# Patient Record
Sex: Female | Born: 1935 | Race: White | Hispanic: No | Marital: Married | Smoking: Never smoker
Health system: Southern US, Community
[De-identification: ages and names within clinical notes are randomized; demographics above are authoritative.]

## PROBLEM LIST (undated history)

## (undated) ENCOUNTER — Inpatient Hospital Stay: Admission: EM | Payer: Self-pay | Source: Home / Self Care

## (undated) DIAGNOSIS — F32A Depression, unspecified: Secondary | ICD-10-CM

## (undated) DIAGNOSIS — E119 Type 2 diabetes mellitus without complications: Secondary | ICD-10-CM

## (undated) DIAGNOSIS — G20 Parkinson's disease: Secondary | ICD-10-CM

## (undated) DIAGNOSIS — M0520 Rheumatoid vasculitis with rheumatoid arthritis of unspecified site: Secondary | ICD-10-CM

## (undated) DIAGNOSIS — I639 Cerebral infarction, unspecified: Secondary | ICD-10-CM

## (undated) DIAGNOSIS — G629 Polyneuropathy, unspecified: Secondary | ICD-10-CM

## (undated) DIAGNOSIS — F329 Major depressive disorder, single episode, unspecified: Secondary | ICD-10-CM

## (undated) DIAGNOSIS — G20A1 Parkinson's disease without dyskinesia, without mention of fluctuations: Secondary | ICD-10-CM

## (undated) HISTORY — DX: Polyneuropathy, unspecified: G62.9

## (undated) HISTORY — DX: Type 2 diabetes mellitus without complications: E11.9

## (undated) HISTORY — DX: Cerebral infarction, unspecified: I63.9

## (undated) HISTORY — DX: Depression, unspecified: F32.A

## (undated) HISTORY — PX: APPENDECTOMY: SHX54

## (undated) HISTORY — PX: TONSILLECTOMY AND ADENOIDECTOMY: SUR1326

## (undated) HISTORY — PX: ABDOMINAL HYSTERECTOMY: SHX81

## (undated) HISTORY — DX: Rheumatoid vasculitis with rheumatoid arthritis of unspecified site: M05.20

## (undated) HISTORY — DX: Major depressive disorder, single episode, unspecified: F32.9

---

## 2012-02-20 ENCOUNTER — Other Ambulatory Visit: Payer: Self-pay | Admitting: Family Medicine

## 2012-02-20 DIAGNOSIS — Z1231 Encounter for screening mammogram for malignant neoplasm of breast: Secondary | ICD-10-CM

## 2012-03-05 ENCOUNTER — Ambulatory Visit: Payer: Self-pay

## 2012-03-16 ENCOUNTER — Ambulatory Visit: Payer: Self-pay

## 2012-04-06 ENCOUNTER — Ambulatory Visit
Admission: RE | Admit: 2012-04-06 | Discharge: 2012-04-06 | Disposition: A | Discharge status: Home / Self Care | Payer: BC Managed Care – PPO | Source: Ambulatory Visit | Attending: Family Medicine | Admitting: Family Medicine

## 2012-04-06 DIAGNOSIS — Z1231 Encounter for screening mammogram for malignant neoplasm of breast: Secondary | ICD-10-CM

## 2012-09-07 ENCOUNTER — Ambulatory Visit: Payer: BC Managed Care – PPO | Admitting: Physical Therapy

## 2012-09-18 ENCOUNTER — Ambulatory Visit: Payer: BC Managed Care – PPO | Admitting: *Deleted

## 2013-07-09 ENCOUNTER — Other Ambulatory Visit (HOSPITAL_BASED_OUTPATIENT_CLINIC_OR_DEPARTMENT_OTHER): Payer: Self-pay | Admitting: Rheumatology

## 2013-07-09 DIAGNOSIS — M5136 Other intervertebral disc degeneration, lumbar region: Secondary | ICD-10-CM

## 2013-07-13 ENCOUNTER — Ambulatory Visit (HOSPITAL_BASED_OUTPATIENT_CLINIC_OR_DEPARTMENT_OTHER): Payer: Medicare Other

## 2013-07-20 ENCOUNTER — Ambulatory Visit (HOSPITAL_BASED_OUTPATIENT_CLINIC_OR_DEPARTMENT_OTHER)
Admission: RE | Admit: 2013-07-20 | Discharge: 2013-07-20 | Disposition: A | Discharge status: Home / Self Care | Payer: MEDICARE | Source: Ambulatory Visit | Attending: Rheumatology | Admitting: Rheumatology

## 2013-07-20 DIAGNOSIS — M5136 Other intervertebral disc degeneration, lumbar region: Secondary | ICD-10-CM

## 2013-07-20 DIAGNOSIS — M25559 Pain in unspecified hip: Secondary | ICD-10-CM | POA: Insufficient documentation

## 2013-07-20 DIAGNOSIS — M48061 Spinal stenosis, lumbar region without neurogenic claudication: Secondary | ICD-10-CM | POA: Insufficient documentation

## 2013-07-20 DIAGNOSIS — M5137 Other intervertebral disc degeneration, lumbosacral region: Secondary | ICD-10-CM | POA: Insufficient documentation

## 2013-07-20 DIAGNOSIS — M549 Dorsalgia, unspecified: Secondary | ICD-10-CM | POA: Insufficient documentation

## 2013-07-20 DIAGNOSIS — M47817 Spondylosis without myelopathy or radiculopathy, lumbosacral region: Secondary | ICD-10-CM | POA: Insufficient documentation

## 2013-07-20 DIAGNOSIS — M51379 Other intervertebral disc degeneration, lumbosacral region without mention of lumbar back pain or lower extremity pain: Secondary | ICD-10-CM | POA: Insufficient documentation

## 2013-12-17 ENCOUNTER — Other Ambulatory Visit: Payer: Self-pay | Admitting: Rheumatology

## 2013-12-17 ENCOUNTER — Ambulatory Visit
Admission: RE | Admit: 2013-12-17 | Discharge: 2013-12-17 | Disposition: A | Discharge status: Home / Self Care | Payer: Medicare Other | Source: Ambulatory Visit | Attending: Rheumatology | Admitting: Rheumatology

## 2013-12-17 DIAGNOSIS — Z5189 Encounter for other specified aftercare: Secondary | ICD-10-CM

## 2014-03-06 ENCOUNTER — Ambulatory Visit
Admission: RE | Admit: 2014-03-06 | Discharge: 2014-03-06 | Disposition: A | Discharge status: Home / Self Care | Payer: MEDICARE | Source: Ambulatory Visit | Attending: Family Medicine | Admitting: Family Medicine

## 2014-03-06 ENCOUNTER — Other Ambulatory Visit: Payer: Self-pay | Admitting: Family Medicine

## 2014-03-06 DIAGNOSIS — M79672 Pain in left foot: Secondary | ICD-10-CM

## 2014-06-06 ENCOUNTER — Encounter (INDEPENDENT_AMBULATORY_CARE_PROVIDER_SITE_OTHER): Payer: BC Managed Care – PPO | Admitting: Ophthalmology

## 2014-06-06 DIAGNOSIS — Z79899 Other long term (current) drug therapy: Secondary | ICD-10-CM

## 2014-06-06 DIAGNOSIS — H43813 Vitreous degeneration, bilateral: Secondary | ICD-10-CM

## 2014-06-06 DIAGNOSIS — H33301 Unspecified retinal break, right eye: Secondary | ICD-10-CM

## 2014-06-20 ENCOUNTER — Ambulatory Visit (INDEPENDENT_AMBULATORY_CARE_PROVIDER_SITE_OTHER): Payer: Medicare Other | Admitting: Ophthalmology

## 2014-06-20 DIAGNOSIS — H33301 Unspecified retinal break, right eye: Secondary | ICD-10-CM

## 2014-10-20 ENCOUNTER — Ambulatory Visit (INDEPENDENT_AMBULATORY_CARE_PROVIDER_SITE_OTHER): Payer: Medicare (Managed Care) | Admitting: Ophthalmology

## 2014-10-20 DIAGNOSIS — H43813 Vitreous degeneration, bilateral: Secondary | ICD-10-CM

## 2014-10-20 DIAGNOSIS — H33302 Unspecified retinal break, left eye: Secondary | ICD-10-CM | POA: Diagnosis not present

## 2015-01-14 DEATH — deceased

## 2015-02-13 DEATH — deceased

## 2015-10-26 ENCOUNTER — Ambulatory Visit (INDEPENDENT_AMBULATORY_CARE_PROVIDER_SITE_OTHER): Payer: Medicare (Managed Care) | Admitting: Ophthalmology

## 2015-11-12 ENCOUNTER — Ambulatory Visit (INDEPENDENT_AMBULATORY_CARE_PROVIDER_SITE_OTHER): Payer: Medicare (Managed Care) | Admitting: Ophthalmology

## 2015-11-17 ENCOUNTER — Encounter: Payer: Self-pay | Admitting: Neurology

## 2015-11-17 ENCOUNTER — Ambulatory Visit (INDEPENDENT_AMBULATORY_CARE_PROVIDER_SITE_OTHER): Payer: Medicare Other | Admitting: Neurology

## 2015-11-17 VITALS — BP 104/54 | HR 61

## 2015-11-17 DIAGNOSIS — G20 Parkinson's disease: Secondary | ICD-10-CM

## 2015-11-17 DIAGNOSIS — R269 Unspecified abnormalities of gait and mobility: Secondary | ICD-10-CM | POA: Diagnosis not present

## 2015-11-17 MED ORDER — CARBIDOPA-LEVODOPA 25-100 MG PO TABS: 1.0000 | ORAL_TABLET | Freq: Three times a day (TID) | ORAL | Status: DC

## 2015-11-17 MED ORDER — ALPRAZOLAM 0.5 MG PO TABS: 0.5000 mg | ORAL_TABLET | Freq: Every evening | ORAL | Status: DC | PRN
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
## 2015-11-18 LAB — COMPREHENSIVE METABOLIC PANEL
A/G RATIO: 1.5 (ref 1.2–2.2)
ALK PHOS: 49 IU/L (ref 39–117)
ALT: 10 IU/L (ref 0–32)
AST: 14 IU/L (ref 0–40)
Albumin: 4 g/dL (ref 3.5–4.7)
BILIRUBIN TOTAL: 0.4 mg/dL (ref 0.0–1.2)
BUN/Creatinine Ratio: 28 (ref 12–28)
BUN: 21 mg/dL (ref 8–27)
CHLORIDE: 96 mmol/L (ref 96–106)
CO2: 24 mmol/L (ref 18–29)
Calcium: 9.3 mg/dL (ref 8.7–10.3)
Creatinine, Ser: 0.76 mg/dL (ref 0.57–1.00)
GFR calc Af Amer: 81 mL/min/{1.73_m2} (ref >59)
GFR calc non Af Amer: 70 mL/min/{1.73_m2} (ref >59)
GLUCOSE: 167 mg/dL — AB (ref 65–99)
Globulin, Total: 2.6 g/dL (ref 1.5–4.5)
POTASSIUM: 3.6 mmol/L (ref 3.5–5.2)
Sodium: 137 mmol/L (ref 134–144)
Total Protein: 6.6 g/dL (ref 6.0–8.5)

## 2015-11-18 LAB — C-REACTIVE PROTEIN: CRP: 1.9 mg/L (ref 0.0–4.9)

## 2015-11-18 LAB — CBC
Hematocrit: 36.1 % (ref 34.0–46.6)
Hemoglobin: 11.7 g/dL (ref 11.1–15.9)
MCH: 30.5 pg (ref 26.6–33.0)
MCHC: 32.4 g/dL (ref 31.5–35.7)
MCV: 94 fL (ref 79–97)
Platelets: 337 10*3/uL (ref 150–379)
RBC: 3.83 x10E6/uL (ref 3.77–5.28)
RDW: 13.9 % (ref 12.3–15.4)
WBC: 8.3 10*3/uL (ref 3.4–10.8)

## 2015-11-18 LAB — VITAMIN B12: VITAMIN B 12: 616 pg/mL (ref 211–946)

## 2015-11-18 LAB — ANA: Anti Nuclear Antibody(ANA): NEGATIVE

## 2015-11-18 LAB — TSH: TSH: 2.24 u[IU]/mL (ref 0.450–4.500)

## 2015-11-18 LAB — SEDIMENTATION RATE: Sed Rate: 22 mm/hr (ref 0–40)

## 2015-11-18 LAB — RPR: RPR: NONREACTIVE

## 2015-11-18 LAB — CK: CK TOTAL: 28 U/L (ref 24–173)

## 2015-11-26 ENCOUNTER — Ambulatory Visit
Admission: RE | Admit: 2015-11-26 | Discharge: 2015-11-26 | Disposition: A | Discharge status: Home / Self Care | Payer: Medicare (Managed Care) | Source: Ambulatory Visit | Attending: Neurology | Admitting: Neurology

## 2015-11-26 DIAGNOSIS — R269 Unspecified abnormalities of gait and mobility: Secondary | ICD-10-CM | POA: Diagnosis not present

## 2015-11-26 DIAGNOSIS — G20 Parkinson's disease: Secondary | ICD-10-CM

## 2015-11-30 ENCOUNTER — Telehealth: Payer: Self-pay | Admitting: Neurology

## 2015-11-30 NOTE — Telephone Encounter (Signed)
Please call patient, MRI of the brain showed moderate atrophy, supratentorium small vessel disease, MRI of the cervical spine showed multilevel degenerative disc disease, but there was no evidence of spinal cord compression,  All the above mentioned changes are chronic, I will go over imaging findings in detail at her next follow-up visit.  IMPRESSION: Abnormal MRI scan of cervical spine showing marked degenerative changes throughout most noticeable at C6-7 where there is moderate left-sided foraminal stenosis and C7-T1 where there is severe left-sided foraminal stenosis.

## 2015-12-01 NOTE — Telephone Encounter (Signed)
Spoke to Eunice Blase (daughter on [employer]) - she is aware of MRI results and will make sure patient keeps her follow up appt.

## 2015-12-24 ENCOUNTER — Ambulatory Visit (INDEPENDENT_AMBULATORY_CARE_PROVIDER_SITE_OTHER): Payer: Medicare Other | Admitting: Neurology

## 2015-12-24 ENCOUNTER — Encounter: Payer: Self-pay | Admitting: Neurology

## 2015-12-24 VITALS — BP 104/58 | HR 60

## 2015-12-24 DIAGNOSIS — G20 Parkinson's disease: Secondary | ICD-10-CM | POA: Diagnosis not present

## 2015-12-24 DIAGNOSIS — R269 Unspecified abnormalities of gait and mobility: Secondary | ICD-10-CM

## 2015-12-24 MED ORDER — CARBIDOPA-LEVODOPA 25-100 MG PO TABS: 2.0000 | ORAL_TABLET | Freq: Three times a day (TID) | ORAL | Status: DC
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
## 2015-12-31 ENCOUNTER — Ambulatory Visit (INDEPENDENT_AMBULATORY_CARE_PROVIDER_SITE_OTHER): Payer: Medicare Other | Admitting: Ophthalmology

## 2015-12-31 DIAGNOSIS — H43813 Vitreous degeneration, bilateral: Secondary | ICD-10-CM

## 2015-12-31 DIAGNOSIS — H33301 Unspecified retinal break, right eye: Secondary | ICD-10-CM

## 2015-12-31 DIAGNOSIS — M069 Rheumatoid arthritis, unspecified: Secondary | ICD-10-CM | POA: Diagnosis not present

## 2015-12-31 DIAGNOSIS — H353121 Nonexudative age-related macular degeneration, left eye, early dry stage: Secondary | ICD-10-CM

## 2016-04-05 ENCOUNTER — Encounter: Payer: Self-pay | Admitting: Neurology

## 2016-04-05 ENCOUNTER — Ambulatory Visit (INDEPENDENT_AMBULATORY_CARE_PROVIDER_SITE_OTHER): Payer: Medicare Other | Admitting: Neurology

## 2016-04-05 VITALS — BP 124/60 | HR 68 | Resp 18 | Ht 63.0 in | Wt 138.0 lb

## 2016-04-05 DIAGNOSIS — R269 Unspecified abnormalities of gait and mobility: Secondary | ICD-10-CM

## 2016-04-05 DIAGNOSIS — G20 Parkinson's disease: Secondary | ICD-10-CM
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
## 2016-07-14 ENCOUNTER — Ambulatory Visit: Payer: Medicare Other | Admitting: Rheumatology

## 2016-10-06 ENCOUNTER — Ambulatory Visit: Payer: Medicare Other | Admitting: Neurology

## 2016-11-24 ENCOUNTER — Ambulatory Visit: Payer: Medicare Other | Admitting: Neurology

## 2016-12-21 ENCOUNTER — Ambulatory Visit (INDEPENDENT_AMBULATORY_CARE_PROVIDER_SITE_OTHER): Payer: Medicare Other | Admitting: Ophthalmology

## 2016-12-22 ENCOUNTER — Ambulatory Visit: Payer: Medicare Other | Admitting: Neurology

## 2016-12-29 ENCOUNTER — Ambulatory Visit (INDEPENDENT_AMBULATORY_CARE_PROVIDER_SITE_OTHER): Payer: Medicare Other | Admitting: Ophthalmology

## 2017-01-05 ENCOUNTER — Ambulatory Visit (INDEPENDENT_AMBULATORY_CARE_PROVIDER_SITE_OTHER): Payer: Medicare Other | Admitting: Ophthalmology

## 2017-02-28 ENCOUNTER — Ambulatory Visit: Payer: Medicare Other | Admitting: Neurology

## 2017-03-02 ENCOUNTER — Ambulatory Visit (INDEPENDENT_AMBULATORY_CARE_PROVIDER_SITE_OTHER): Payer: Medicare Other | Admitting: Ophthalmology

## 2017-04-13 ENCOUNTER — Encounter: Payer: Self-pay | Admitting: Neurology

## 2017-04-13 ENCOUNTER — Encounter (INDEPENDENT_AMBULATORY_CARE_PROVIDER_SITE_OTHER): Payer: Medicare Other | Admitting: Ophthalmology

## 2017-04-13 ENCOUNTER — Ambulatory Visit (INDEPENDENT_AMBULATORY_CARE_PROVIDER_SITE_OTHER): Payer: Medicare Other | Admitting: Neurology

## 2017-04-13 VITALS — BP 138/62 | HR 64 | Ht 63.0 in | Wt 123.0 lb

## 2017-04-13 DIAGNOSIS — R4781 Slurred speech: Secondary | ICD-10-CM | POA: Insufficient documentation

## 2017-04-13 DIAGNOSIS — G20 Parkinson's disease: Secondary | ICD-10-CM | POA: Diagnosis not present

## 2017-04-13 DIAGNOSIS — R269 Unspecified abnormalities of gait and mobility: Secondary | ICD-10-CM
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
## 2017-05-04 ENCOUNTER — Encounter (INDEPENDENT_AMBULATORY_CARE_PROVIDER_SITE_OTHER): Payer: Medicare Other | Admitting: Ophthalmology

## 2017-05-04 DIAGNOSIS — M069 Rheumatoid arthritis, unspecified: Secondary | ICD-10-CM

## 2017-05-04 DIAGNOSIS — I10 Essential (primary) hypertension: Secondary | ICD-10-CM

## 2017-05-04 DIAGNOSIS — H43813 Vitreous degeneration, bilateral: Secondary | ICD-10-CM

## 2017-05-04 DIAGNOSIS — Z79899 Other long term (current) drug therapy: Secondary | ICD-10-CM | POA: Diagnosis not present

## 2017-05-04 DIAGNOSIS — H35033 Hypertensive retinopathy, bilateral: Secondary | ICD-10-CM | POA: Diagnosis not present

## 2017-05-09 ENCOUNTER — Ambulatory Visit: Payer: Medicare Other | Admitting: Neurology

## 2017-08-08 ENCOUNTER — Emergency Department (HOSPITAL_COMMUNITY): Payer: Medicare Other

## 2017-08-08 ENCOUNTER — Emergency Department (HOSPITAL_COMMUNITY)
Admission: EM | Admit: 2017-08-08 | Discharge: 2017-08-08 | Disposition: A | Discharge status: Home / Self Care | Payer: Medicare Other | Attending: Emergency Medicine | Admitting: Emergency Medicine

## 2017-08-08 ENCOUNTER — Encounter (HOSPITAL_COMMUNITY): Payer: Self-pay | Admitting: Internal Medicine

## 2017-08-08 DIAGNOSIS — Z7984 Long term (current) use of oral hypoglycemic drugs: Secondary | ICD-10-CM | POA: Insufficient documentation

## 2017-08-08 DIAGNOSIS — Y9389 Activity, other specified: Secondary | ICD-10-CM | POA: Diagnosis not present

## 2017-08-08 DIAGNOSIS — Y92129 Unspecified place in nursing home as the place of occurrence of the external cause: Secondary | ICD-10-CM | POA: Insufficient documentation

## 2017-08-08 DIAGNOSIS — Z79899 Other long term (current) drug therapy: Secondary | ICD-10-CM | POA: Diagnosis not present

## 2017-08-08 DIAGNOSIS — Y998 Other external cause status: Secondary | ICD-10-CM | POA: Insufficient documentation

## 2017-08-08 DIAGNOSIS — S0990XA Unspecified injury of head, initial encounter: Secondary | ICD-10-CM | POA: Diagnosis present

## 2017-08-08 DIAGNOSIS — M542 Cervicalgia: Secondary | ICD-10-CM | POA: Insufficient documentation

## 2017-08-08 DIAGNOSIS — W19XXXA Unspecified fall, initial encounter: Secondary | ICD-10-CM | POA: Insufficient documentation

## 2017-08-08 DIAGNOSIS — M25512 Pain in left shoulder: Secondary | ICD-10-CM | POA: Diagnosis not present

## 2017-08-08 DIAGNOSIS — E1140 Type 2 diabetes mellitus with diabetic neuropathy, unspecified: Secondary | ICD-10-CM | POA: Insufficient documentation

## 2017-08-08 DIAGNOSIS — Z8673 Personal history of transient ischemic attack (TIA), and cerebral infarction without residual deficits: Secondary | ICD-10-CM | POA: Diagnosis not present

## 2017-08-08 LAB — CBC WITH DIFFERENTIAL/PLATELET
BASOS ABS: 0 10*3/uL (ref 0.0–0.1)
BASOS PCT: 0 %
EOS ABS: 1.3 10*3/uL — AB (ref 0.0–0.7)
Eosinophils Relative: 11 %
HCT: 36.5 % (ref 36.0–46.0)
HEMOGLOBIN: 11.8 g/dL — AB (ref 12.0–15.0)
LYMPHS ABS: 2.2 10*3/uL (ref 0.7–4.0)
Lymphocytes Relative: 19 %
MCH: 30.4 pg (ref 26.0–34.0)
MCHC: 32.3 g/dL (ref 30.0–36.0)
MCV: 94.1 fL (ref 78.0–100.0)
Monocytes Absolute: 0.9 10*3/uL (ref 0.1–1.0)
Monocytes Relative: 7 %
NEUTROS PCT: 63 %
Neutro Abs: 7.2 10*3/uL (ref 1.7–7.7)
Platelets: 226 10*3/uL (ref 150–400)
RBC: 3.88 MIL/uL (ref 3.87–5.11)
RDW: 13.9 % (ref 11.5–15.5)
WBC: 11.6 10*3/uL — AB (ref 4.0–10.5)

## 2017-08-08 LAB — BASIC METABOLIC PANEL
Anion gap: 7 (ref 5–15)
BUN: 18 mg/dL (ref 6–20)
CHLORIDE: 105 mmol/L (ref 101–111)
CO2: 26 mmol/L (ref 22–32)
CREATININE: 0.67 mg/dL (ref 0.44–1.00)
Calcium: 9.2 mg/dL (ref 8.9–10.3)
GFR calc non Af Amer: >60 mL/min (ref >60)
Glucose, Bld: 109 mg/dL — ABNORMAL HIGH (ref 65–99)
Potassium: 4.4 mmol/L (ref 3.5–5.1)
SODIUM: 138 mmol/L (ref 135–145)

## 2017-08-08 LAB — I-STAT TROPONIN, ED: TROPONIN I, POC: 0 ng/mL (ref 0.00–0.08)

## 2017-08-08 MED ORDER — ACETAMINOPHEN 325 MG PO TABS
650.0000 mg | ORAL_TABLET | Freq: Once | ORAL | Status: AC
  Administered 2017-08-08: 650 mg via ORAL
  Filled 2017-08-08: qty 2

## 2017-08-08 NOTE — ED Provider Notes (Signed)
[facility]-EMERGENCY DEPT Provider Note   CSN: [id number] Arrival date & time: 08/08/17  1607     History   Chief Complaint Chief Complaint  Patient presents with  . Fall    HPI Ashlee Reid is a 83 y.o. female with a past medical history of diabetes, peripheral neuropathy, prior stroke, who presents to ED for evaluation of unwitnessed fall that occurred approximately 3 hours prior to arrival.  She resides at a skilled nursing facility.  Patient states that her feet slipped out from under her while she was using her walker and she landed on the left side of her head.  She denies any loss of consciousness.  She states that she was on the floor for about 35-40 minutes before the nursing staff found her.  She currently reports left-sided headache, neck pain and shoulder pain.  She denies any chest pain, vision changes, vomiting, numbness in legs, back pain, abdominal pain.  HPI  Past Medical History:  Diagnosis Date  . Depression   . Diabetes (HCC)   . Diabetes (HCC)   . Peripheral neuropathy   . Rheumatoid arteritis   . Stroke [facility])     Patient Active Problem List   Diagnosis Date Noted  . Slurred speech 04/13/2017  . Parkinsonism (HCC) 11/17/2015  . Abnormality of gait 11/17/2015    Past Surgical History:  Procedure Laterality Date  . ABDOMINAL HYSTERECTOMY    . APPENDECTOMY    . TONSILLECTOMY AND ADENOIDECTOMY      OB History    No data available       Home Medications    Prior to Admission medications   Medication Sig Start Date End Date Taking? Authorizing Provider  acetaminophen (TYLENOL) 500 MG tablet Take 500 mg by mouth every 8 (eight) hours as needed for mild pain.    Yes [provider]  acetaminophen (TYLENOL) 650 MG CR tablet Take 650 mg by mouth at bedtime as needed for pain.   Yes [provider]  aspirin 81 MG tablet Take 81 mg by mouth daily.   Yes [provider]  Calcium Carbonate-Vitamin D  (CALCIUM 600+D) 600-400 MG-UNIT tablet Take 1 tablet by mouth 2 (two) times daily.   Yes [provider]  carbidopa-levodopa (SINEMET IR) 25-100 MG tablet Take 2 tablets by mouth 3 (three) times daily. 12/24/15  Yes Levert Feinstein, MD  carvedilol (COREG) 3.125 MG tablet Take 3.125 mg by mouth 2 (two) times daily with a meal. 0800 & 1700   Yes [provider]  celecoxib (CELEBREX) 100 MG capsule 2 (two) times daily. 10/20/15  Yes [provider]  escitalopram (LEXAPRO) 10 MG tablet Take 15 mg by mouth daily.   Yes [provider]  gabapentin (NEURONTIN) 300 MG capsule Take 300 mg by mouth at bedtime.  10/01/15  Yes [provider]  glipiZIDE (GLUCOTROL) 5 MG tablet daily. 10/23/15  Yes [provider]  hydroxychloroquine (PLAQUENIL) 200 MG tablet daily. 11/10/15  Yes [provider]  levothyroxine (SYNTHROID, LEVOTHROID) 25 MCG tablet daily. 11/14/15  Yes [provider]  Menthol, Topical Analgesic, (BIOFREEZE COLORLESS EX) 1 application by Other route every 4 (four) hours as needed (pain). Apply to 5% gel to the neck   Yes [provider]  nitroGLYCERIN (NITROSTAT) 0.4 MG SL tablet Place 0.4 mg under the tongue every 5 (five) minutes as needed for chest pain.   Yes [provider]  Omega-3 Fatty Acids (FISH OIL) 1000 MG CAPS Take  by mouth 2 (two) times daily.   Yes [provider]  omeprazole (PRILOSEC) 20 MG capsule Take 20 mg by mouth 2 (two) times daily before a meal.  11/13/15  Yes [provider]  Polyethyl Glycol-Propyl Glycol (SYSTANE) 0.4-0.3 % GEL ophthalmic gel Place 2 application into both eyes 4 (four) times daily.    Yes [provider]  polyethylene glycol (MIRALAX / GLYCOLAX) packet Take 17 g by mouth daily as needed for mild constipation or moderate constipation.   Yes [provider]  potassium chloride (KLOR-CON) 20 MEQ packet Take 20 mEq by mouth daily.   Yes [provider]  pravastatin (PRAVACHOL) 20 MG tablet Take 20 mg by mouth at bedtime.  11/10/15  Yes [provider]    Family History Family History  Problem Relation Age of Onset  . Congestive Heart Failure Father   . Alzheimer's disease Mother   . Stroke Mother   . Diabetes Daughter     Social History Social History   Tobacco Use  . Smoking status: Never Smoker  . Smokeless tobacco: Current User    Types: Chew  Substance Use Topics  . Alcohol use: No    Alcohol/week: 0.0 oz  . Drug use: No     Allergies   Sulfa antibiotics   Review of Systems Review of Systems  Constitutional: Negative for appetite change, chills and fever.  HENT: Negative for ear pain, rhinorrhea, sneezing and sore throat.   Eyes: Negative for photophobia and visual disturbance.  Respiratory: Negative for cough, chest tightness, shortness of breath and wheezing.   Cardiovascular: Negative for chest pain and palpitations.  Gastrointestinal: Negative for abdominal pain, blood in stool, constipation, diarrhea, nausea and vomiting.  Genitourinary: Negative for dysuria, hematuria and urgency.  Musculoskeletal: Positive for arthralgias, myalgias and neck pain.  Skin: Negative for rash.  Neurological: Positive for headaches. Negative for dizziness, weakness and light-headedness.     Physical Exam Updated Vital Signs BP (!) 199/72 (BP Location: Right Arm)   Pulse (!) 58   Temp 98.1 F (36.7 C) (Oral)   Resp 16   SpO2 97%   Physical Exam  Constitutional: She is oriented to person, place, and time. She appears well-developed and well-nourished. No distress.  HENT:  Head: Normocephalic and atraumatic.  Nose: Nose normal.  Eyes: Conjunctivae and EOM are normal. Left eye exhibits no discharge. No scleral icterus.  Neck: Normal range of motion. Neck supple.  Cardiovascular: Normal rate, regular rhythm, normal heart sounds and intact distal pulses. Exam reveals no gallop and no friction rub.    No murmur heard. Pulmonary/Chest: Effort normal and breath sounds normal. No respiratory distress.  Abdominal: Soft. Bowel sounds are normal. She exhibits no distension. There is no tenderness. There is no guarding.  No abdominal or chest tenderness to palpation.  Musculoskeletal: Normal range of motion. She exhibits tenderness. She exhibits no edema.       Arms: Paraspinal musculature tenderness noted bilaterally. Pain with ROM of neck. No midline spinal tenderness present in lumbar, thoracic or cervical spine. No step-off palpated. No visible bruising, edema or temperature change noted. No objective signs of numbness present. No saddle anesthesia. 2+ DP pulses bilaterally. Sensation intact to light touch. Strength 5/5 in bilateral lower extremities.  Neurological: She is alert and oriented to person, place, and time. No cranial nerve deficit or sensory deficit. She exhibits normal muscle tone. Coordination normal.  Pupils reactive. No facial asymmetry noted. Cranial nerves appear grossly intact. Sensation  intact to light touch on face, BUE and BLE. Strength 5/5 in BUE and BLE. Normal finger to nose coordination bilaterally. Alert and oriented to self, place, time and situation.  Skin: Skin is warm and dry. No rash noted.  Superficial skin tear noted to L upper arm, bleeding controlled.  Psychiatric: She has a normal mood and affect.  Nursing note and vitals reviewed.    ED Treatments / Results  Labs (all labs ordered are listed, but only abnormal results are displayed) Labs Reviewed  BASIC METABOLIC PANEL - Abnormal; Notable for the following components:      Result Value   Glucose, Bld 109 (*)    All other components within normal limits  CBC WITH DIFFERENTIAL/PLATELET - Abnormal; Notable for the following components:   WBC 11.6 (*)    Hemoglobin 11.8 (*)    Eosinophils Absolute 1.3 (*)    All other components within normal limits  I-STAT TROPONIN, ED    EKG  EKG  Interpretation  Date/Time:  Tuesday August 08 2017 20:00:55 EST Ventricular Rate:  55 PR Interval:  186 QRS Duration: 80 QT Interval:  468 QTC Calculation: 447 R Axis:   6 Text Interpretation:  Sinus bradycardia Low voltage QRS Borderline ECG No prior ECG for comparison.  No STEMI Confirmed by Theda Belfast ([id number]) on 08/08/2017 8:07:38 PM       Radiology Ct Head Wo Contrast  Result Date: 08/08/2017 CLINICAL DATA:  Traumatic injury. EXAM: CT HEAD WITHOUT CONTRAST CT CERVICAL SPINE WITHOUT CONTRAST TECHNIQUE: Multidetector CT imaging of the head and cervical spine was performed following the standard protocol without intravenous contrast. Multiplanar CT image reconstructions of the cervical spine were also generated. COMPARISON:  None. FINDINGS: CT HEAD FINDINGS Brain: Mild chronic ischemic white matter disease is noted. Mild diffuse cortical atrophy. No mass effect or midline shift is noted. Ventricular size is within normal limits. There is no evidence of mass lesion, hemorrhage or acute infarction. Vascular: No hyperdense vessel or unexpected calcification. Skull: Normal. Negative for fracture or focal lesion. Sinuses/Orbits: Mild left ethmoid sinusitis is noted. Other: None. CT CERVICAL SPINE FINDINGS Alignment: Mild grade 1 anterolisthesis of C4-5 secondary to posterior facet joint hypertrophy. Reversal of normal lordosis is noted secondary to degenerative change. Skull base and vertebrae: No acute fracture. No primary bone lesion or focal pathologic process. Soft tissues and spinal canal: No prevertebral fluid or swelling. No visible canal hematoma. Disc levels: Severe degenerative disc disease is noted at C3-4, C5-6, C6-7 and C7-T1. Upper chest: Negative. Other: Degenerative changes are seen involving posterior facet joints bilaterally. IMPRESSION: Mild chronic ischemic white matter disease. Mild diffuse cortical atrophy. No acute intracranial abnormality seen. Severe multilevel  degenerative disc disease. No acute abnormality seen in the cervical spine. Electronically Signed   By: Lupita Raider, M.D.   On: 08/08/2017 19:10   Ct Cervical Spine Wo Contrast  Result Date: 08/08/2017 CLINICAL DATA:  Traumatic injury. EXAM: CT HEAD WITHOUT CONTRAST CT CERVICAL SPINE WITHOUT CONTRAST TECHNIQUE: Multidetector CT imaging of the head and cervical spine was performed following the standard protocol without intravenous contrast. Multiplanar CT image reconstructions of the cervical spine were also generated. COMPARISON:  None. FINDINGS: CT HEAD FINDINGS Brain: Mild chronic ischemic white matter disease is noted. Mild diffuse cortical atrophy. No mass effect or midline shift is noted. Ventricular size is within normal limits. There is no evidence of mass lesion, hemorrhage or acute infarction. Vascular: No hyperdense vessel or unexpected calcification. Skull: Normal. Negative for fracture  or focal lesion. Sinuses/Orbits: Mild left ethmoid sinusitis is noted. Other: None. CT CERVICAL SPINE FINDINGS Alignment: Mild grade 1 anterolisthesis of C4-5 secondary to posterior facet joint hypertrophy. Reversal of normal lordosis is noted secondary to degenerative change. Skull base and vertebrae: No acute fracture. No primary bone lesion or focal pathologic process. Soft tissues and spinal canal: No prevertebral fluid or swelling. No visible canal hematoma. Disc levels: Severe degenerative disc disease is noted at C3-4, C5-6, C6-7 and C7-T1. Upper chest: Negative. Other: Degenerative changes are seen involving posterior facet joints bilaterally. IMPRESSION: Mild chronic ischemic white matter disease. Mild diffuse cortical atrophy. No acute intracranial abnormality seen. Severe multilevel degenerative disc disease. No acute abnormality seen in the cervical spine. Electronically Signed   By: Lupita Raider, M.D.   On: 08/08/2017 19:10   Dg Shoulder Left  Result Date: 08/08/2017 CLINICAL DATA:  Left  shoulder pain after fall at home today. EXAM: LEFT SHOULDER - 2+ VIEW COMPARISON:  None. FINDINGS: There is no evidence of fracture or dislocation. Severe narrowing of the left glenohumeral joint is noted. Soft tissues are unremarkable. IMPRESSION: Severe degenerative joint disease of the left glenohumeral joint. No acute abnormality seen in the left shoulder. Electronically Signed   By: Lupita Raider, M.D.   On: 08/08/2017 17:55    Procedures Procedures (including critical care time)  Medications Ordered in ED Medications  acetaminophen (TYLENOL) tablet 650 mg (650 mg Oral Given 08/08/17 1837)     Initial Impression / Assessment and Plan / ED Course  I have reviewed the triage vital signs and the nursing notes.  Pertinent labs & imaging results that were available during my care of the patient were reviewed by me and considered in my medical decision making (see chart for details).     Patient presents to ED for evaluation of unwitnessed fall that occurred 3 hours prior to arrival.  She resides at a skilled nursing facility.  She was on the floor for about 35-40 minutes before the nursing staff.  She reports left-sided headache, neck pain and shoulder pain.  She is alert and oriented x4.  She does have a superficial skin tear noted on the left upper arm.  No other signs of injury noted.  CT of head and neck return as negative.  Her shoulder x-ray returned as negative.  Lab work including CBC, BMP and troponin was unremarkable.  EKG with no ischemic changes.  I suspect that patient is medically cleared for return to her skilled nursing facility.  Advised to return for any worsening or severe symptoms.  Patient appears stable for discharge at this time.  Strict return precautions given.  Patient discussed with and seen by Dr. Rush Landmark.  Final Clinical Impressions(s) / ED Diagnoses   Final diagnoses:  Injury of head, initial encounter    ED Discharge Orders    None     Portions of  this note were generated with Dragon dictation software. Dictation errors may occur despite best attempts at proofreading.    Dietrich Pates, PA-C 08/08/17 2035    Tegeler, Canary Brim, MD 08/09/17 [phone]

## 2017-08-08 NOTE — ED Notes (Signed)
Patient transported to CT 

## 2017-08-08 NOTE — ED Notes (Signed)
ED Provider at bedside. 

## 2017-08-08 NOTE — ED Notes (Signed)
This RN attempted to gain IV access x2. Asked another RN for help getting IV access and drawing labs.

## 2017-08-08 NOTE — ED Notes (Signed)
Noted skin tear to patient's right forearm when inserting IV. Dressed skin tear with non adherent dressing and gauze. Also re-dressed skin tear on upper left arm with same.

## 2017-08-08 NOTE — ED Triage Notes (Signed)
Pt arrived via [facility] from [facility] after an unwitnessed fall. Per patient, she was on the ground for about 1 hour before staff at the nursing facility found her. She reports hitting her head, but denies LOC and is not on blood thinners. Pt denies shortness of breath and chest pain. Per EMS, patient had neck pain with movement, so pt is in collar. Pt complaining of shoulder pain.

## 2017-08-08 NOTE — ED Notes (Signed)
Bed: [facility] Expected date:  Expected time:  Means of arrival:  Comments: EMS-fall 

## 2017-08-08 NOTE — Discharge Instructions (Signed)
Continue your home medications as previously prescribed. Follow-up with your primary care provider for further evaluation. Return to ED for worsening symptoms, additional head injuries, chest pain, shortness of breath, back pain or loss of bladder function.

## 2017-10-12 ENCOUNTER — Encounter: Payer: Self-pay | Admitting: Neurology

## 2017-10-12 ENCOUNTER — Ambulatory Visit (INDEPENDENT_AMBULATORY_CARE_PROVIDER_SITE_OTHER): Payer: Medicare Other | Admitting: Neurology

## 2017-10-12 VITALS — BP 119/62 | HR 57 | Ht 63.0 in | Wt 117.5 lb

## 2017-10-12 DIAGNOSIS — G20 Parkinson's disease: Secondary | ICD-10-CM | POA: Diagnosis not present

## 2017-10-12 DIAGNOSIS — R269 Unspecified abnormalities of gait and mobility: Secondary | ICD-10-CM | POA: Diagnosis not present

## 2017-10-12 DIAGNOSIS — R4781 Slurred speech: Secondary | ICD-10-CM
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
## 2017-10-13 ENCOUNTER — Telehealth: Payer: Self-pay | Admitting: *Deleted

## 2017-10-13 LAB — CK: Total CK: 56 U/L (ref 24–173)

## 2017-10-13 NOTE — Telephone Encounter (Signed)
LMOM for Ashlee Reid that per YY, labs drawn in our office yesterday are normal.  She does not need to return this call unless she has questions/fim

## 2017-10-13 NOTE — Telephone Encounter (Signed)
-----   Message from Levert Feinstein, MD sent at 10/13/2017  8:44 AM EST ----- Please call patient for normal laboratory result

## 2017-10-24 ENCOUNTER — Other Ambulatory Visit: Payer: Self-pay

## 2017-10-24 ENCOUNTER — Emergency Department (HOSPITAL_COMMUNITY): Payer: Medicare Other

## 2017-10-24 ENCOUNTER — Emergency Department (HOSPITAL_COMMUNITY)
Admission: EM | Admit: 2017-10-24 | Discharge: 2017-10-25 | Disposition: A | Discharge status: Home / Self Care | Payer: Medicare Other | Attending: Emergency Medicine | Admitting: Emergency Medicine

## 2017-10-24 DIAGNOSIS — Y929 Unspecified place or not applicable: Secondary | ICD-10-CM | POA: Diagnosis not present

## 2017-10-24 DIAGNOSIS — S161XXA Strain of muscle, fascia and tendon at neck level, initial encounter: Secondary | ICD-10-CM

## 2017-10-24 DIAGNOSIS — Z7984 Long term (current) use of oral hypoglycemic drugs: Secondary | ICD-10-CM | POA: Diagnosis not present

## 2017-10-24 DIAGNOSIS — S060X0A Concussion without loss of consciousness, initial encounter: Secondary | ICD-10-CM | POA: Insufficient documentation

## 2017-10-24 DIAGNOSIS — Z8673 Personal history of transient ischemic attack (TIA), and cerebral infarction without residual deficits: Secondary | ICD-10-CM | POA: Diagnosis not present

## 2017-10-24 DIAGNOSIS — S0990XA Unspecified injury of head, initial encounter: Secondary | ICD-10-CM | POA: Diagnosis present

## 2017-10-24 DIAGNOSIS — Y998 Other external cause status: Secondary | ICD-10-CM | POA: Insufficient documentation

## 2017-10-24 DIAGNOSIS — Z7982 Long term (current) use of aspirin: Secondary | ICD-10-CM | POA: Insufficient documentation

## 2017-10-24 DIAGNOSIS — Z79899 Other long term (current) drug therapy: Secondary | ICD-10-CM | POA: Insufficient documentation

## 2017-10-24 DIAGNOSIS — W0110XA Fall on same level from slipping, tripping and stumbling with subsequent striking against unspecified object, initial encounter: Secondary | ICD-10-CM | POA: Diagnosis not present

## 2017-10-24 DIAGNOSIS — G20 Parkinson's disease: Secondary | ICD-10-CM | POA: Insufficient documentation

## 2017-10-24 DIAGNOSIS — S42294A Other nondisplaced fracture of upper end of right humerus, initial encounter for closed fracture: Secondary | ICD-10-CM | POA: Diagnosis not present

## 2017-10-24 DIAGNOSIS — F329 Major depressive disorder, single episode, unspecified: Secondary | ICD-10-CM | POA: Insufficient documentation

## 2017-10-24 DIAGNOSIS — E119 Type 2 diabetes mellitus without complications: Secondary | ICD-10-CM | POA: Insufficient documentation

## 2017-10-24 DIAGNOSIS — Y9389 Activity, other specified: Secondary | ICD-10-CM | POA: Diagnosis not present

## 2017-10-24 DIAGNOSIS — W19XXXA Unspecified fall, initial encounter: Secondary | ICD-10-CM

## 2017-10-24 MED ORDER — FENTANYL CITRATE (PF) 100 MCG/2ML IJ SOLN: 50.0000 ug | INTRAMUSCULAR | Status: DC | PRN

## 2017-10-24 NOTE — ED Provider Notes (Signed)
[facility] EMERGENCY DEPARTMENT Provider Note   CSN: [id number] Arrival date & time: 10/24/17  2313     History   Chief Complaint Chief Complaint  Patient presents with  . Fall    HPI [facility] is a 84 y.o. female.  The history is provided by the patient.  Fall  This is a new problem. Episode onset: Several hours ago. The problem occurs constantly. The problem has been gradually worsening. Associated symptoms include headaches. Pertinent negatives include no chest pain and no abdominal pain. The symptoms are aggravated by walking. The symptoms are relieved by rest.   With history of depression, diabetes, previous stroke presents after a fall.  She lives in independent living facility.  She was walking in a parking lot, she tripped and fell.  She hit her head, but denies LOC.  She reports right arm pain. She denies any other complaints. Apparently she had an x-ray of her right arm that revealed a fracture, but there is no report available at this time. Chart review, she is not on anticoagulants Past Medical History:  Diagnosis Date  . Depression   . Diabetes (HCC)   . Diabetes (HCC)   . Peripheral neuropathy   . Rheumatoid arteritis   . Stroke [facility])     Patient Active Problem List   Diagnosis Date Noted  . Slurred speech 04/13/2017  . Parkinsonism (HCC) 11/17/2015  . Abnormality of gait 11/17/2015    Past Surgical History:  Procedure Laterality Date  . ABDOMINAL HYSTERECTOMY    . APPENDECTOMY    . TONSILLECTOMY AND ADENOIDECTOMY      OB History    No data available       Home Medications    Prior to Admission medications   Medication Sig Start Date End Date Taking? Authorizing Provider  acetaminophen (TYLENOL) 500 MG tablet Take 500 mg by mouth every 8 (eight) hours as needed for mild pain.     [provider]  acetaminophen (TYLENOL) 650 MG CR tablet Take 650 mg by mouth at bedtime as needed for pain.    [provider]  aspirin 81 MG tablet Take 81 mg by mouth daily.    [provider]  Calcium Carbonate-Vitamin D (CALCIUM 600+D) 600-400 MG-UNIT tablet Take 1 tablet by mouth 2 (two) times daily.    [provider]  carbidopa-levodopa (SINEMET IR) 25-100 MG tablet Take 2 tablets by mouth 3 (three) times daily. 12/24/15   Levert Feinstein, MD  carvedilol (COREG) 3.125 MG tablet Take 3.125 mg by mouth 2 (two) times daily with a meal. 0800 & 1700    [provider]  celecoxib (CELEBREX) 100 MG capsule 2 (two) times daily. 10/20/15   [provider]  escitalopram (LEXAPRO) 10 MG tablet Take 15 mg by mouth daily.    [provider]  gabapentin (NEURONTIN) 300 MG capsule Take 300 mg by mouth at bedtime.  10/01/15   [provider]  glipiZIDE (GLUCOTROL) 5 MG tablet daily. 10/23/15   [provider]  hydroxychloroquine (PLAQUENIL) 200 MG tablet daily. 11/10/15   [provider]  levothyroxine (SYNTHROID, LEVOTHROID) 25 MCG tablet daily. 11/14/15   [provider]  Menthol, Topical Analgesic, (BIOFREEZE COLORLESS EX) 1 application by Other route every 4 (four) hours as needed (pain). Apply to 5% gel to the neck    [provider]  nitroGLYCERIN (NITROSTAT) 0.4 MG SL tablet Place 0.4 mg under the tongue every 5 (five) minutes as needed  for chest pain.    [provider]  Omega-3 Fatty Acids (FISH OIL) 1000 MG CAPS Take by mouth 2 (two) times daily.    [provider]  omeprazole (PRILOSEC) 20 MG capsule Take 20 mg by mouth 2 (two) times daily before a meal.  11/13/15   [provider]  Polyethyl Glycol-Propyl Glycol (SYSTANE) 0.4-0.3 % GEL ophthalmic gel Place 2 application into both eyes 4 (four) times daily.     [provider]  polyethylene glycol (MIRALAX / GLYCOLAX) packet Take 17 g by mouth daily as needed for mild constipation or moderate constipation.    [provider]  potassium  chloride (KLOR-CON) 20 MEQ packet Take 20 mEq by mouth daily.    [provider]  pravastatin (PRAVACHOL) 20 MG tablet Take 20 mg by mouth at bedtime.  11/10/15   [provider]    Family History Family History  Problem Relation Age of Onset  . Congestive Heart Failure Father   . Alzheimer's disease Mother   . Stroke Mother   . Diabetes Daughter     Social History Social History   Tobacco Use  . Smoking status: Never Smoker  . Smokeless tobacco: Current User    Types: Chew  Substance Use Topics  . Alcohol use: No    Alcohol/week: 0.0 oz  . Drug use: No     Allergies   Sulfa antibiotics   Review of Systems Review of Systems  Constitutional: Negative for fever.  Cardiovascular: Negative for chest pain.  Gastrointestinal: Negative for abdominal pain.  Musculoskeletal: Positive for arthralgias. Negative for back pain.  Neurological: Positive for headaches.  All other systems reviewed and are negative.    Physical Exam Updated Vital Signs BP (!) 174/67 (BP Location: Left Arm)   Pulse (!) 57   Temp 98.2 F (36.8 C) (Oral)   Resp 16   Ht 1.6 m (5\' 3" )   Wt 56.2 kg (124 lb)   SpO2 95%   BMI 21.97 kg/m   Physical Exam CONSTITUTIONAL: Elderly and frail HEAD: Bruising surrounding right orbit EYES: EOMI/PERRL, no signs of ocular trauma ENMT: Mucous membranes moist, abrasion to nose, no facial tenderness, no nasal deformity.  No obvious septal hematoma.  No dental or mandibular injury noted NECK: supple no meningeal signs SPINE/BACK: Kyphotic spine, no bruising/crepitance/stepoffs noted to spine, patient maintained in spinal precautions/logroll utilized CV: S1/S2 noted, no murmurs/rubs/gallops noted LUNGS: Lungs are clear to auscultation bilaterally, no apparent distress Chest-no chest wall tenderness or crepitus. ABDOMEN: soft, nontender NEURO: Pt is awake/alert/appropriate, moves all extremitiesx4.  No facial droop.   EXTREMITIES: pulses  normal/equal, tenderness and swelling to right humerus.  Bruising and tenderness to right elbow.  Mild bruising to the bilateral patella, but no tenderness, full range of motion.  Pelvis stable.  All other extremities/joints palpated/ranged and nontender SKIN: warm, color normal PSYCH: no abnormalities of mood noted, alert and oriented to situation   ED Treatments / Results  Labs (all labs ordered are listed, but only abnormal results are displayed) Labs Reviewed - No data to display  EKG  EKG Interpretation None       Radiology Dg Shoulder Right  Result Date: 10/25/2017 CLINICAL DATA:  84 year old female with fall and right upper extremity pain. EXAM: RIGHT SHOULDER - 2+ VIEW; RIGHT ELBOW - COMPLETE 3+ VIEW COMPARISON:  None. FINDINGS: There is angulated appearance of the right humeral neck most concerning for a fracture. There is a focal area of cortical discontinuity along  the lateral aspect of the humeral neck. No other acute fracture identified. The bones are osteopenic. There is no dislocation. No joint effusion. There is elevation of the right humeral head likely related to chronic rotator cuff injury. IMPRESSION: 1. Findings most suspicious for a nondisplaced fracture of the right humeral neck. 2. No acute fracture or dislocation of the right elbow. Electronically Signed   By: Elgie Collard M.D.   On: 10/25/2017 00:50   Dg Elbow Complete Right  Result Date: 10/25/2017 CLINICAL DATA:  84 year old female with fall and right upper extremity pain. EXAM: RIGHT SHOULDER - 2+ VIEW; RIGHT ELBOW - COMPLETE 3+ VIEW COMPARISON:  None. FINDINGS: There is angulated appearance of the right humeral neck most concerning for a fracture. There is a focal area of cortical discontinuity along the lateral aspect of the humeral neck. No other acute fracture identified. The bones are osteopenic. There is no dislocation. No joint effusion. There is elevation of the right humeral head likely related to  chronic rotator cuff injury. IMPRESSION: 1. Findings most suspicious for a nondisplaced fracture of the right humeral neck. 2. No acute fracture or dislocation of the right elbow. Electronically Signed   By: Elgie Collard M.D.   On: 10/25/2017 00:50   Ct Head Wo Contrast  Result Date: 10/25/2017 CLINICAL DATA:  Status post fall in parking lot, with concern for head or cervical spine injury. EXAM: CT HEAD WITHOUT CONTRAST CT CERVICAL SPINE WITHOUT CONTRAST TECHNIQUE: Multidetector CT imaging of the head and cervical spine was performed following the standard protocol without intravenous contrast. Multiplanar CT image reconstructions of the cervical spine were also generated. COMPARISON:  CT of the head and cervical spine performed 08/08/2017 FINDINGS: CT HEAD FINDINGS Brain: No evidence of acute infarction, hemorrhage, hydrocephalus, extra-axial collection or mass lesion / mass effect. Prominence of the ventricles and sulci reflects mild to moderate cortical volume loss. Mild cerebellar atrophy is noted. Scattered periventricular and subcortical white matter change likely reflects small vessel ischemic microangiopathy. The brainstem and fourth ventricle are within normal limits. The basal ganglia are unremarkable in appearance. The cerebral hemispheres demonstrate grossly normal gray-white differentiation. No mass effect or midline shift is seen. Vascular: No hyperdense vessel or unexpected calcification. Skull: There is no evidence of fracture; visualized osseous structures are unremarkable in appearance. Sinuses/Orbits: The orbits are within normal limits. The paranasal sinuses and mastoid air cells are well-aerated. Other: No significant soft tissue abnormalities are seen. CT CERVICAL SPINE FINDINGS Alignment: There is grade 1 anterolisthesis of C4 on C5, reflecting underlying facet disease. Skull base and vertebrae: No acute fracture. No primary bone lesion or focal pathologic process. Soft tissues and  spinal canal: No prevertebral fluid or swelling. No visible canal hematoma. Disc levels: Multilevel disc space narrowing is noted along the cervical and upper thoracic spine. Degenerative change is noted about the dens, with an associated set of degenerative osseous fragments at the tip of the dens. Upper chest: The visualized lung apices are clear. The thyroid gland is diminutive and grossly unremarkable. Other: No additional soft tissue abnormalities are seen. IMPRESSION: 1. No evidence of traumatic intracranial injury or fracture. 2. No evidence of fracture or subluxation along the cervical spine. 3. Mild to moderate cortical volume loss and scattered small vessel ischemic microangiopathy. 4. Mild degenerative change along the cervical and upper thoracic spine. Electronically Signed   By: Roanna Raider M.D.   On: 10/25/2017 00:39   Ct Cervical Spine Wo Contrast  Result Date: 10/25/2017 CLINICAL DATA:  Status post fall in parking lot, with concern for head or cervical spine injury. EXAM: CT HEAD WITHOUT CONTRAST CT CERVICAL SPINE WITHOUT CONTRAST TECHNIQUE: Multidetector CT imaging of the head and cervical spine was performed following the standard protocol without intravenous contrast. Multiplanar CT image reconstructions of the cervical spine were also generated. COMPARISON:  CT of the head and cervical spine performed 08/08/2017 FINDINGS: CT HEAD FINDINGS Brain: No evidence of acute infarction, hemorrhage, hydrocephalus, extra-axial collection or mass lesion / mass effect. Prominence of the ventricles and sulci reflects mild to moderate cortical volume loss. Mild cerebellar atrophy is noted. Scattered periventricular and subcortical white matter change likely reflects small vessel ischemic microangiopathy. The brainstem and fourth ventricle are within normal limits. The basal ganglia are unremarkable in appearance. The cerebral hemispheres demonstrate grossly normal gray-white differentiation. No mass  effect or midline shift is seen. Vascular: No hyperdense vessel or unexpected calcification. Skull: There is no evidence of fracture; visualized osseous structures are unremarkable in appearance. Sinuses/Orbits: The orbits are within normal limits. The paranasal sinuses and mastoid air cells are well-aerated. Other: No significant soft tissue abnormalities are seen. CT CERVICAL SPINE FINDINGS Alignment: There is grade 1 anterolisthesis of C4 on C5, reflecting underlying facet disease. Skull base and vertebrae: No acute fracture. No primary bone lesion or focal pathologic process. Soft tissues and spinal canal: No prevertebral fluid or swelling. No visible canal hematoma. Disc levels: Multilevel disc space narrowing is noted along the cervical and upper thoracic spine. Degenerative change is noted about the dens, with an associated set of degenerative osseous fragments at the tip of the dens. Upper chest: The visualized lung apices are clear. The thyroid gland is diminutive and grossly unremarkable. Other: No additional soft tissue abnormalities are seen. IMPRESSION: 1. No evidence of traumatic intracranial injury or fracture. 2. No evidence of fracture or subluxation along the cervical spine. 3. Mild to moderate cortical volume loss and scattered small vessel ischemic microangiopathy. 4. Mild degenerative change along the cervical and upper thoracic spine. Electronically Signed   By: Roanna Raider M.D.   On: 10/25/2017 00:39    Procedures Procedures  SPLINT APPLICATION Date/Time: 3:09 AM Authorized by: Joya Gaskins Consent: Verbal consent obtained. Risks and benefits: risks, benefits and alternatives were discussed Consent given by: patient Splint applied by: Nurse Location details: Right arm Splint type: Sling Supplies used: Plain Post-procedure: The splinted body part was neurovascularly unchanged following the procedure. Patient tolerance: Patient tolerated the procedure well with no  immediate complications.    Medications Ordered in ED Medications  fentaNYL (SUBLIMAZE) injection 50 mcg (not administered)     Initial Impression / Assessment and Plan / ED Course  I have reviewed the triage vital signs and the nursing notes.  Pertinent labs & imaging results that were available during my care of the patient were reviewed by me and considered in my medical decision making (see chart for details).     11:44 PM Patient with mechanical fall, here for possible right humerus fracture.  She also has evidence of other injuries.  Imaging ordered.  Unable to clear C-spine clinically, will obtain CT C-spine 3:08 AM X-ray reveals stable right humerus fracture.  No other acute findings.  She feels improved with sling on.  She has been able to stand without any other pain.  She lives in a facility.  They have been made aware of her injury.  She would need to go back in a wheelchair until seen by orthopedic  daughter  at bedside who feels comfortable taking her back to the facility.  Refer to orthopedics. Final Clinical Impressions(s) / ED Diagnoses   Final diagnoses:  Fall, initial encounter  Strain of neck muscle, initial encounter  Concussion without loss of consciousness, initial encounter  Other closed nondisplaced fracture of proximal end of right humerus, initial encounter    ED Discharge Orders        Ordered    HYDROcodone-acetaminophen (NORCO/VICODIN) 5-325 MG tablet  Every 6 hours PRN     10/25/17 0255       Zadie Rhine, MD 10/25/17 [phone]

## 2017-10-24 NOTE — ED Triage Notes (Signed)
Patient lives in an independent living facility ([facility]) and was walking in parking lot earlier today and fell. The facility did an x-ray and the return result was a fx in right upper arm.

## 2017-10-25 ENCOUNTER — Emergency Department (HOSPITAL_COMMUNITY): Payer: Medicare Other

## 2017-10-25 LAB — CBG MONITORING, ED
GLUCOSE-CAPILLARY: 154 mg/dL — AB (ref 65–99)
GLUCOSE-CAPILLARY: 181 mg/dL — AB (ref 65–99)

## 2017-10-25 MED ORDER — HYDROCODONE-ACETAMINOPHEN 5-325 MG PO TABS: 1.0000 | ORAL_TABLET | Freq: Four times a day (QID) | ORAL | 0 refills | Status: DC | PRN

## 2017-10-25 NOTE — ED Notes (Signed)
Patient was only able to ambulate a couple of steps and was in too much pain/too weak to walk any more. Requested to sit on bed.

## 2017-10-25 NOTE — ED Notes (Signed)
Report to Tyra RN at [facility]. DNR sent with daughter and DC paperwork

## 2017-10-25 NOTE — ED Notes (Signed)
Spoke with Tyra @ [facility]; she states that patient has been very independent up to this point and needs little to no assistance. Uses walker at facility.

## 2017-10-25 NOTE — ED Notes (Signed)
Ortho page

## 2017-10-25 NOTE — ED Notes (Signed)
Shoulder immobilizer placed.

## 2018-03-22 ENCOUNTER — Telehealth: Payer: Self-pay | Admitting: Neurology

## 2018-03-22 NOTE — Telephone Encounter (Signed)
Pts daughter Eunice Blase requesting a call, stating the pt hasn't been taking any medication prescribed by Dr. Terrace Arabia. Debbie unsure on any names of the medication but requesting a call back to discuss calling them in.

## 2018-03-22 NOTE — Telephone Encounter (Signed)
Left message requesting her daughter, Lupita Raider (on [place]) to return my call.  The patient was last seen on 10/12/17 for her Parkinson's Disease.  The only medication prescribed by Dr. Terrace Arabia is Sinemet 25-100mg , two tablets TID.

## 2018-03-23 MED ORDER — CARBIDOPA-LEVODOPA 25-100 MG PO TABS: 2.0000 | ORAL_TABLET | Freq: Three times a day (TID) | ORAL | 11 refills | Status: AC

## 2018-03-23 NOTE — Telephone Encounter (Signed)
Spoke to patient's daughter.  Previously her mother was a resident at [facility] and getting her Sinemet through the facility.   The patient is now living with her daughter and they need a prescription sent to [facility] so she can continue her medication.

## 2018-05-14 ENCOUNTER — Encounter (INDEPENDENT_AMBULATORY_CARE_PROVIDER_SITE_OTHER): Payer: Medicare Other | Admitting: Ophthalmology

## 2018-05-14 DIAGNOSIS — H35033 Hypertensive retinopathy, bilateral: Secondary | ICD-10-CM | POA: Diagnosis not present

## 2018-05-14 DIAGNOSIS — H43813 Vitreous degeneration, bilateral: Secondary | ICD-10-CM | POA: Diagnosis not present

## 2018-05-14 DIAGNOSIS — I10 Essential (primary) hypertension: Secondary | ICD-10-CM

## 2018-05-14 DIAGNOSIS — M069 Rheumatoid arthritis, unspecified: Secondary | ICD-10-CM

## 2018-10-15 ENCOUNTER — Ambulatory Visit: Payer: Medicare Other | Admitting: Neurology

## 2019-01-19 ENCOUNTER — Inpatient Hospital Stay (HOSPITAL_COMMUNITY)
Admission: EM | Admit: 2019-01-19 | Discharge: 2019-01-24 | DRG: 392 | Disposition: A | Discharge status: Skilled Nursing Facility | Payer: Medicare Other | Attending: Internal Medicine | Admitting: Internal Medicine

## 2019-01-19 ENCOUNTER — Other Ambulatory Visit: Payer: Self-pay

## 2019-01-19 ENCOUNTER — Encounter (HOSPITAL_COMMUNITY): Payer: Self-pay | Admitting: *Deleted

## 2019-01-19 ENCOUNTER — Emergency Department (HOSPITAL_COMMUNITY): Payer: Medicare Other

## 2019-01-19 DIAGNOSIS — K529 Noninfective gastroenteritis and colitis, unspecified: Secondary | ICD-10-CM | POA: Diagnosis present

## 2019-01-19 DIAGNOSIS — E1142 Type 2 diabetes mellitus with diabetic polyneuropathy: Secondary | ICD-10-CM

## 2019-01-19 DIAGNOSIS — G20 Parkinson's disease: Secondary | ICD-10-CM | POA: Diagnosis present

## 2019-01-19 DIAGNOSIS — R197 Diarrhea, unspecified: Secondary | ICD-10-CM | POA: Diagnosis present

## 2019-01-19 DIAGNOSIS — R4781 Slurred speech: Secondary | ICD-10-CM

## 2019-01-19 DIAGNOSIS — E876 Hypokalemia: Secondary | ICD-10-CM | POA: Diagnosis present

## 2019-01-19 DIAGNOSIS — E860 Dehydration: Secondary | ICD-10-CM | POA: Diagnosis present

## 2019-01-19 DIAGNOSIS — A09 Infectious gastroenteritis and colitis, unspecified: Secondary | ICD-10-CM

## 2019-01-19 DIAGNOSIS — Z7982 Long term (current) use of aspirin: Secondary | ICD-10-CM

## 2019-01-19 DIAGNOSIS — Z820 Family history of epilepsy and other diseases of the nervous system: Secondary | ICD-10-CM

## 2019-01-19 DIAGNOSIS — Z8673 Personal history of transient ischemic attack (TIA), and cerebral infarction without residual deficits: Secondary | ICD-10-CM | POA: Diagnosis not present

## 2019-01-19 DIAGNOSIS — Z79899 Other long term (current) drug therapy: Secondary | ICD-10-CM

## 2019-01-19 DIAGNOSIS — Z720 Tobacco use: Secondary | ICD-10-CM | POA: Diagnosis not present

## 2019-01-19 DIAGNOSIS — Z8249 Family history of ischemic heart disease and other diseases of the circulatory system: Secondary | ICD-10-CM

## 2019-01-19 DIAGNOSIS — I10 Essential (primary) hypertension: Secondary | ICD-10-CM | POA: Diagnosis present

## 2019-01-19 DIAGNOSIS — Z90710 Acquired absence of both cervix and uterus: Secondary | ICD-10-CM | POA: Diagnosis not present

## 2019-01-19 DIAGNOSIS — Z823 Family history of stroke: Secondary | ICD-10-CM

## 2019-01-19 DIAGNOSIS — K5900 Constipation, unspecified: Secondary | ICD-10-CM | POA: Diagnosis present

## 2019-01-19 DIAGNOSIS — Z79891 Long term (current) use of opiate analgesic: Secondary | ICD-10-CM

## 2019-01-19 DIAGNOSIS — R269 Unspecified abnormalities of gait and mobility: Secondary | ICD-10-CM

## 2019-01-19 DIAGNOSIS — Z1159 Encounter for screening for other viral diseases: Secondary | ICD-10-CM | POA: Diagnosis not present

## 2019-01-19 DIAGNOSIS — E11649 Type 2 diabetes mellitus with hypoglycemia without coma: Secondary | ICD-10-CM | POA: Diagnosis not present

## 2019-01-19 DIAGNOSIS — F329 Major depressive disorder, single episode, unspecified: Secondary | ICD-10-CM | POA: Diagnosis present

## 2019-01-19 DIAGNOSIS — M069 Rheumatoid arthritis, unspecified: Secondary | ICD-10-CM | POA: Diagnosis present

## 2019-01-19 DIAGNOSIS — Z882 Allergy status to sulfonamides status: Secondary | ICD-10-CM

## 2019-01-19 DIAGNOSIS — R9431 Abnormal electrocardiogram [ECG] [EKG]: Secondary | ICD-10-CM | POA: Diagnosis present

## 2019-01-19 DIAGNOSIS — Z833 Family history of diabetes mellitus: Secondary | ICD-10-CM

## 2019-01-19 DIAGNOSIS — G20C Parkinsonism, unspecified: Secondary | ICD-10-CM | POA: Diagnosis present

## 2019-01-19 DIAGNOSIS — Z79890 Hormone replacement therapy (postmenopausal): Secondary | ICD-10-CM

## 2019-01-19 HISTORY — DX: Parkinson's disease without dyskinesia, without mention of fluctuations: G20.A1

## 2019-01-19 HISTORY — DX: Parkinson's disease: G20

## 2019-01-19 LAB — CBC
HCT: 38.2 % (ref 36.0–46.0)
HCT: 35.1 % — ABNORMAL LOW (ref 36.0–46.0)
Hemoglobin: 12.1 g/dL (ref 12.0–15.0)
Hemoglobin: 11.1 g/dL — ABNORMAL LOW (ref 12.0–15.0)
MCH: 30.3 pg (ref 26.0–34.0)
MCH: 30.5 pg (ref 26.0–34.0)
MCHC: 31.7 g/dL (ref 30.0–36.0)
MCHC: 31.6 g/dL (ref 30.0–36.0)
MCV: 95.7 fL (ref 80.0–100.0)
MCV: 96.4 fL (ref 80.0–100.0)
Platelets: 273 10*3/uL (ref 150–400)
Platelets: 282 10*3/uL (ref 150–400)
RBC: 3.99 MIL/uL (ref 3.87–5.11)
RBC: 3.64 MIL/uL — ABNORMAL LOW (ref 3.87–5.11)
RDW: 12.6 % (ref 11.5–15.5)
RDW: 12.9 % (ref 11.5–15.5)
WBC: 13.4 10*3/uL — ABNORMAL HIGH (ref 4.0–10.5)
WBC: 10.3 10*3/uL (ref 4.0–10.5)
nRBC: 0 % (ref 0.0–0.2)
nRBC: 0 % (ref 0.0–0.2)

## 2019-01-19 LAB — COMPREHENSIVE METABOLIC PANEL
ALT: 6 U/L (ref 0–44)
AST: 18 U/L (ref 15–41)
Albumin: 3.7 g/dL (ref 3.5–5.0)
Alkaline Phosphatase: 57 U/L (ref 38–126)
Anion gap: 11 (ref 5–15)
BUN: 22 mg/dL (ref 8–23)
CO2: 26 mmol/L (ref 22–32)
Calcium: 8.9 mg/dL (ref 8.9–10.3)
Chloride: 100 mmol/L (ref 98–111)
Creatinine, Ser: 0.72 mg/dL (ref 0.44–1.00)
GFR calc Af Amer: >60 mL/min (ref >60)
GFR calc non Af Amer: >60 mL/min (ref >60)
Glucose, Bld: 99 mg/dL (ref 70–99)
Potassium: 2.8 mmol/L — ABNORMAL LOW (ref 3.5–5.1)
Sodium: 137 mmol/L (ref 135–145)
Total Bilirubin: 0.8 mg/dL (ref 0.3–1.2)
Total Protein: 7 g/dL (ref 6.5–8.1)

## 2019-01-19 LAB — URINALYSIS, ROUTINE W REFLEX MICROSCOPIC
Bilirubin Urine: NEGATIVE
Glucose, UA: NEGATIVE mg/dL
Hgb urine dipstick: NEGATIVE
Ketones, ur: 5 mg/dL — AB
Leukocytes,Ua: NEGATIVE
Nitrite: NEGATIVE
Protein, ur: NEGATIVE mg/dL
Specific Gravity, Urine: 1.017 (ref 1.005–1.030)
pH: 5 (ref 5.0–8.0)

## 2019-01-19 LAB — CREATININE, SERUM
Creatinine, Ser: 0.69 mg/dL (ref 0.44–1.00)
GFR calc Af Amer: >60 mL/min (ref >60)
GFR calc non Af Amer: >60 mL/min (ref >60)

## 2019-01-19 LAB — SARS CORONAVIRUS 2 BY RT PCR (HOSPITAL ORDER, PERFORMED IN ~~LOC~~ HOSPITAL LAB): SARS Coronavirus 2: NEGATIVE

## 2019-01-19 LAB — POTASSIUM: Potassium: 3 mmol/L — ABNORMAL LOW (ref 3.5–5.1)

## 2019-01-19 LAB — HEMOGLOBIN A1C
Hgb A1c MFr Bld: 6.3 % — ABNORMAL HIGH (ref 4.8–5.6)
Mean Plasma Glucose: 134.11 mg/dL

## 2019-01-19 LAB — GLUCOSE, CAPILLARY
Glucose-Capillary: 66 mg/dL — ABNORMAL LOW (ref 70–99)
Glucose-Capillary: 81 mg/dL (ref 70–99)

## 2019-01-19 LAB — LIPASE, BLOOD: Lipase: 20 U/L (ref 11–51)

## 2019-01-19 LAB — MAGNESIUM: Magnesium: 1.8 mg/dL (ref 1.7–2.4)

## 2019-01-19 LAB — LACTIC ACID, PLASMA: Lactic Acid, Venous: 0.8 mmol/L (ref 0.5–1.9)

## 2019-01-19 MED ORDER — POTASSIUM CHLORIDE 10 MEQ/100ML IV SOLN
10.0000 meq | Freq: Once | INTRAVENOUS | Status: AC
  Administered 2019-01-19: 10 meq via INTRAVENOUS
  Filled 2019-01-19: qty 100

## 2019-01-19 MED ORDER — IOHEXOL 300 MG/ML  SOLN
100.0000 mL | Freq: Once | INTRAMUSCULAR | Status: AC | PRN
  Administered 2019-01-19: 100 mL via INTRAVENOUS

## 2019-01-19 MED ORDER — SODIUM CHLORIDE 0.9 % IV BOLUS
500.0000 mL | Freq: Once | INTRAVENOUS | Status: AC
  Administered 2019-01-19: 500 mL via INTRAVENOUS

## 2019-01-19 MED ORDER — SODIUM CHLORIDE 0.9% FLUSH: 3.0000 mL | Freq: Once | INTRAVENOUS | Status: DC

## 2019-01-19 MED ORDER — POTASSIUM CHLORIDE CRYS ER 20 MEQ PO TBCR
40.0000 meq | EXTENDED_RELEASE_TABLET | Freq: Once | ORAL | Status: AC
  Administered 2019-01-19: 40 meq via ORAL
  Filled 2019-01-19: qty 2

## 2019-01-19 MED ORDER — CIPROFLOXACIN IN D5W 400 MG/200ML IV SOLN
400.0000 mg | Freq: Once | INTRAVENOUS | Status: AC
  Administered 2019-01-19: 400 mg via INTRAVENOUS
  Filled 2019-01-19: qty 200

## 2019-01-19 MED ORDER — FENTANYL CITRATE (PF) 100 MCG/2ML IJ SOLN
25.0000 ug | Freq: Once | INTRAMUSCULAR | Status: DC
  Filled 2019-01-19: qty 2

## 2019-01-19 MED ORDER — SODIUM CHLORIDE 0.9 % IV SOLN
INTRAVENOUS | Status: DC
  Administered 2019-01-19 – 2019-01-21 (×4): via INTRAVENOUS

## 2019-01-19 MED ORDER — POTASSIUM CHLORIDE 20 MEQ PO PACK
20.0000 meq | PACK | Freq: Every day | ORAL | Status: DC
  Administered 2019-01-20 – 2019-01-21 (×2): 20 meq via ORAL
  Filled 2019-01-19 (×2): qty 1

## 2019-01-19 MED ORDER — SODIUM CHLORIDE 0.9 % IV SOLN
2.0000 g | INTRAVENOUS | Status: DC
  Administered 2019-01-19 – 2019-01-21 (×3): 2 g via INTRAVENOUS
  Filled 2019-01-19 (×2): qty 20
  Filled 2019-01-19: qty 2

## 2019-01-19 MED ORDER — ACETAMINOPHEN 325 MG PO TABS: 650.0000 mg | ORAL_TABLET | Freq: Four times a day (QID) | ORAL | Status: DC | PRN

## 2019-01-19 MED ORDER — METRONIDAZOLE IN NACL 5-0.79 MG/ML-% IV SOLN
500.0000 mg | Freq: Once | INTRAVENOUS | Status: AC
  Administered 2019-01-19: 500 mg via INTRAVENOUS
  Filled 2019-01-19: qty 100

## 2019-01-19 MED ORDER — SODIUM CHLORIDE (PF) 0.9 % IJ SOLN
INTRAMUSCULAR | Status: AC
  Filled 2019-01-19: qty 50

## 2019-01-19 MED ORDER — ACETAMINOPHEN 650 MG RE SUPP: 650.0000 mg | Freq: Four times a day (QID) | RECTAL | Status: DC | PRN

## 2019-01-19 MED ORDER — CARBIDOPA-LEVODOPA 25-100 MG PO TABS
2.0000 | ORAL_TABLET | Freq: Three times a day (TID) | ORAL | Status: DC
  Administered 2019-01-19 – 2019-01-24 (×16): 2 via ORAL
  Filled 2019-01-19 (×16): qty 2

## 2019-01-19 MED ORDER — HYDROCODONE-ACETAMINOPHEN 5-325 MG PO TABS
1.0000 | ORAL_TABLET | Freq: Four times a day (QID) | ORAL | Status: DC | PRN
  Administered 2019-01-20: 1 via ORAL
  Filled 2019-01-19: qty 1

## 2019-01-19 MED ORDER — GABAPENTIN 300 MG PO CAPS
300.0000 mg | ORAL_CAPSULE | Freq: Every day | ORAL | Status: DC
  Administered 2019-01-19 – 2019-01-23 (×5): 300 mg via ORAL
  Filled 2019-01-19 (×5): qty 1

## 2019-01-19 MED ORDER — INSULIN ASPART 100 UNIT/ML ~~LOC~~ SOLN
0.0000 [IU] | Freq: Three times a day (TID) | SUBCUTANEOUS | Status: DC
  Administered 2019-01-20: 1 [IU] via SUBCUTANEOUS
  Administered 2019-01-21: 2 [IU] via SUBCUTANEOUS

## 2019-01-19 MED ORDER — INSULIN ASPART 100 UNIT/ML ~~LOC~~ SOLN: 0.0000 [IU] | Freq: Every day | SUBCUTANEOUS | Status: DC

## 2019-01-19 MED ORDER — HEPARIN SODIUM (PORCINE) 5000 UNIT/ML IJ SOLN
5000.0000 [IU] | Freq: Three times a day (TID) | INTRAMUSCULAR | Status: DC
  Administered 2019-01-19 – 2019-01-24 (×13): 5000 [IU] via SUBCUTANEOUS
  Filled 2019-01-19 (×11): qty 1

## 2019-01-19 NOTE — ED Notes (Signed)
Patient transported to CT 

## 2019-01-19 NOTE — ED Provider Notes (Signed)
[facility]-EMERGENCY DEPT Provider Note   CSN: [id number] Arrival date & time: 01/19/19  0807    History   Chief Complaint Chief Complaint  Patient presents with  . Diarrhea    HPI Ashlee Reid is a 85 y.o. female.     Ashlee Reid is a 85 y.o. female with a history of diabetes, Parkinson's, rheumatoid arthritis, stroke and depression, who presents to the emergency department for evaluation of diarrhea.  Patient reports that since 2:00 this morning she has had 6 episodes of diarrhea.  Reports she was having pain in the middle of her abdomen earlier this morning reports pain improved now.  She has not had any associated nausea or vomiting.  She has not noticed any blood in her stools.  Denies fevers or chills.  Reports she frequently goes between constipation and diarrhea, but this diarrhea is worse than usual.  She reports feeling a bit generally weak, thinks this may be because she has not had anything to eat yesterday but denies any other acute symptoms, no chest pain, shortness of breath or cough.  She has not been on any antibiotics recently. Denies any other aggravating or alleviating factors. Patient lives at home with her daughter and son-in-law.     Past Medical History:  Diagnosis Date  . Depression   . Diabetes (HCC)   . Diabetes (HCC)   . Parkinson disease (HCC)   . Peripheral neuropathy   . Rheumatoid arteritis (HCC)   . Stroke [facility](HCC)     Patient Active Problem List   Diagnosis Date Noted  . Slurred speech 04/13/2017  . Parkinsonism (HCC) 11/17/2015  . Abnormality of gait 11/17/2015    Past Surgical History:  Procedure Laterality Date  . ABDOMINAL HYSTERECTOMY    . APPENDECTOMY    . TONSILLECTOMY AND ADENOIDECTOMY       OB History   No obstetric history on file.      Home Medications    Prior to Admission medications   Medication Sig Start Date End Date Taking? Authorizing Provider  acetaminophen (TYLENOL) 500 MG  tablet Take 500 mg by mouth every 8 (eight) hours as needed for mild pain.     [provider]  acetaminophen (TYLENOL) 650 MG CR tablet Take 650 mg by mouth daily with supper.     [provider]  aspirin 81 MG tablet Take 81 mg by mouth daily.    [provider]  Calcium Carbonate-Vitamin D (CALCIUM 600+D) 600-400 MG-UNIT tablet Take 1 tablet by mouth 2 (two) times daily.    [provider]  carbidopa-levodopa (SINEMET IR) 25-100 MG tablet Take 2 tablets by mouth 3 (three) times daily. 03/23/18   Levert FeinsteinYan, Yijun, MD  carvedilol (COREG) 3.125 MG tablet Take 3.125 mg by mouth 2 (two) times daily with a meal. 0800 & 1700    [provider]  celecoxib (CELEBREX) 100 MG capsule Take 100 mg by mouth 2 (two) times daily.  10/20/15   [provider]  escitalopram (LEXAPRO) 10 MG tablet Take 15 mg by mouth daily.    [provider]  gabapentin (NEURONTIN) 300 MG capsule Take 300 mg by mouth at bedtime.  10/01/15   [provider]  glipiZIDE (GLUCOTROL) 5 MG tablet Take 5 mg by mouth daily before breakfast.  10/23/15   [provider]  HYDROcodone-acetaminophen (NORCO/VICODIN) 5-325 MG tablet Take 1 tablet by mouth every 6 (six) hours as needed for severe pain. 10/25/17   Zadie RhineWickline, Donald, MD  hydroxychloroquine (PLAQUENIL) 200 MG tablet Take 200 mg by mouth daily.  11/10/15   [provider]  levothyroxine (SYNTHROID, LEVOTHROID) 25 MCG tablet Take 25 mcg by mouth daily before breakfast.  11/14/15   [provider]  Menthol, Topical Analgesic, (BIOFREEZE COLORLESS EX) 1 application by Other route every 4 (four) hours as needed (pain). Apply to 5% gel to the neck    [provider]  nitroGLYCERIN (NITROSTAT) 0.4 MG SL tablet Place 0.4 mg under the tongue every 5 (five) minutes as needed for chest pain.    [provider]  Omega-3 Fatty Acids (FISH OIL) 1000 MG CAPS Take 1,000 mg by mouth 2 (two) times daily.      [provider]  omeprazole (PRILOSEC) 20 MG capsule Take 20 mg by mouth 2 (two) times daily before a meal.  11/13/15   [provider]  Polyethyl Glycol-Propyl Glycol (SYSTANE) 0.4-0.3 % SOLN Place 1 drop into both eyes 2 (two) times daily as needed.    [provider]  polyethylene glycol (MIRALAX / GLYCOLAX) packet Take 17 g by mouth daily as needed for mild constipation or moderate constipation.    [provider]  potassium chloride (KLOR-CON) 20 MEQ packet Take 20 mEq by mouth daily.    [provider]  pravastatin (PRAVACHOL) 20 MG tablet Take 20 mg by mouth at bedtime.  11/10/15   [provider]    Family History Family History  Problem Relation Age of Onset  . Congestive Heart Failure Father   . Alzheimer's disease Mother   . Stroke Mother   . Diabetes Daughter     Social History Social History   Tobacco Use  . Smoking status: Never Smoker  . Smokeless tobacco: Current User    Types: Chew  Substance Use Topics  . Alcohol use: No    Alcohol/week: 0.0 standard drinks  . Drug use: No     Allergies   Sulfa antibiotics   Review of Systems Review of Systems  Constitutional: Negative for chills and fever.  HENT: Negative.   Eyes: Negative for visual disturbance.  Respiratory: Negative for cough and shortness of breath.   Cardiovascular: Negative for chest pain.  Gastrointestinal: Positive for abdominal pain and diarrhea. Negative for blood in stool, nausea and vomiting.  Genitourinary: Negative for dysuria and frequency.  Musculoskeletal: Negative for arthralgias and myalgias.  Skin: Negative for color change and rash.     Physical Exam Updated Vital Signs BP (!) 158/60 (BP Location: Left Arm)   Pulse (!) 57   Temp (!) 97.5 F (36.4 C) (Oral)   Resp (!) 21   Ht 5\' 3"  (1.6 m)   Wt 47.6 kg   SpO2 93%   BMI 18.60 kg/m   Physical Exam Vitals signs and nursing note reviewed. Exam conducted with a  chaperone present.  Constitutional:      General: She is not in acute distress.    Appearance: She is well-developed and normal weight. She is not ill-appearing or diaphoretic.  HENT:     Head: Normocephalic and atraumatic.     Mouth/Throat:     Mouth: Mucous membranes are moist.     Pharynx: Oropharynx is clear.  Eyes:     General:        Right eye: No discharge.        Left eye: No discharge.     Pupils: Pupils are equal, round, and reactive to light.  Neck:     Musculoskeletal:  Neck supple.  Cardiovascular:     Rate and Rhythm: Normal rate and regular rhythm.     Heart sounds: Normal heart sounds. No murmur. No friction rub. No gallop.   Pulmonary:     Effort: Pulmonary effort is normal. No respiratory distress.     Breath sounds: Normal breath sounds. No wheezing or rales.     Comments: Respirations equal and unlabored, patient able to speak in full sentences, lungs clear to auscultation bilaterally Abdominal:     General: Bowel sounds are normal. There is no distension.     Palpations: Abdomen is soft. There is no mass.     Tenderness: There is abdominal tenderness. There is no guarding.     Comments: Abdomen soft and non-distended, generalized tenderness present throughout the abdomen, but without peritoneal signs.  Genitourinary:    Comments: Speculum exam does not reveal any rectovaginal fistula there is no fecal material within the vaginal vault. Chaperone present during exam. Musculoskeletal:        General: No deformity.  Skin:    General: Skin is warm and dry.     Capillary Refill: Capillary refill takes less than 2 seconds.  Neurological:     Mental Status: She is alert.     Coordination: Coordination normal.     Comments: Speech is clear, able to follow commands CN III-XII intact Normal strength in upper and lower extremities bilaterally including dorsiflexion and plantar flexion, strong and equal grip strength Sensation normal to light and sharp touch Moves  extremities without ataxia, coordination intact   Psychiatric:        Mood and Affect: Mood normal.        Behavior: Behavior normal.      ED Treatments / Results  Labs (all labs ordered are listed, but only abnormal results are displayed) Labs Reviewed  COMPREHENSIVE METABOLIC PANEL - Abnormal; Notable for the following components:      Result Value   Potassium 2.8 (*)    All other components within normal limits  CBC - Abnormal; Notable for the following components:   WBC 13.4 (*)    All other components within normal limits  URINALYSIS, ROUTINE W REFLEX MICROSCOPIC - Abnormal; Notable for the following components:   APPearance HAZY (*)    Ketones, ur 5 (*)    All other components within normal limits  SARS CORONAVIRUS 2 (HOSPITAL ORDER, PERFORMED IN [facility])  C DIFFICILE QUICK SCREEN W PCR REFLEX  GASTROINTESTINAL PANEL BY PCR, STOOL (REPLACES STOOL CULTURE)  LIPASE, BLOOD  LACTIC ACID, PLASMA  MAGNESIUM  POTASSIUM    EKG EKG Interpretation  Date/Time:  Saturday January 19 2019 08:22:14 EDT Ventricular Rate:  64 PR Interval:    QRS Duration: 91 QT Interval:  621 QTC Calculation: 641 R Axis:   -33 Text Interpretation:  Sinus rhythm Left axis deviation Abnormal R-wave progression, early transition Nonspecific T abnrm, anterolateral leads Prolonged QT interval Confirmed by Kennis Carina [phone]) on 01/19/2019 10:22:53 AM Also confirmed by Kennis Carina [phone]), editor Barbette Hair [phone])  on 01/19/2019 1:10:24 PM   Radiology Ct Abdomen Pelvis W Contrast  Result Date: 01/19/2019 CLINICAL DATA:  Multiple episodes of diarrhea. Diffuse abdominal pain. EXAM: CT ABDOMEN AND PELVIS WITH CONTRAST TECHNIQUE: Multidetector CT imaging of the abdomen and pelvis was performed using the standard protocol following bolus administration of intravenous contrast. CONTRAST:  OMNIPAQUE IOHEXOL 300 MG/ML  SOLN COMPARISON:  None. FINDINGS: Lower chest: Normal heart size.  Lung bases are  clear. No pleural effusion. Hepatobiliary: Liver is normal in size and contour. Subcentimeter too small to characterize low-attenuation lesion hepatic dome (image 11; series 2). Multiple stones within the gallbladder lumen. No intrahepatic or extrahepatic biliary ductal dilatation. Pancreas: Atrophic Spleen: Unremarkable Adrenals/Urinary Tract: Normal adrenal glands. Kidneys enhance symmetrically with contrast. No hydronephrosis. Urinary bladder is unremarkable. Stomach/Bowel: Normal morphology of the stomach. No evidence for small bowel obstruction. No free fluid or free intraperitoneal air. There is wall thickening of the descending and sigmoid colon. Sigmoid colonic diverticulosis. Additionally, there is mild wall thickening of the cecum and ascending colon. Vascular/Lymphatic: Normal caliber abdominal aorta with peripheral calcified atherosclerotic plaque. No retroperitoneal lymphadenopathy. Reproductive: Status post hysterectomy. Small amount of gas with vaginal cuff. Other: None. Musculoskeletal: No aggressive or acute appearing osseous lesions. IMPRESSION: There is wall thickening of the colon most compatible with colitis. Small amount of gas within the vagina which is nonspecific in etiology. Recommend clinical correlation to exclude the possibility colovaginal fistula. Electronically Signed   By: Annia Belt M.D.   On: 01/19/2019 10:17    Procedures Procedures (including critical care time)  Medications Ordered in ED Medications  sodium chloride (PF) 0.9 % injection (0 mLs  Hold 01/19/19 0940)  0.9 %  sodium chloride infusion ( Intravenous New Bag/Given 01/19/19 1205)  sodium chloride 0.9 % bolus 500 mL (0 mLs Intravenous Stopped 01/19/19 1203)  iohexol (OMNIPAQUE) 300 MG/ML solution 100 mL (100 mLs Intravenous Contrast Given 01/19/19 0941)  potassium chloride SA (K-DUR) CR tablet 40 mEq (40 mEq Oral Given 01/19/19 1107)  potassium chloride 10 mEq in 100 mL IVPB (0 mEq Intravenous Stopped  01/19/19 1203)  ciprofloxacin (CIPRO) IVPB 400 mg (0 mg Intravenous Stopped 01/19/19 1202)  metroNIDAZOLE (FLAGYL) IVPB 500 mg (500 mg Intravenous New Bag/Given 01/19/19 1204)     Initial Impression / Assessment and Plan / ED Course  I have reviewed the triage vital signs and the nursing notes.  Pertinent labs & imaging results that were available during my care of the patient were reviewed by me and considered in my medical decision making (see chart for details).  Patient presents for evaluation of abdominal pain and multiple episodes of frequent diarrhea.  She has mild generalized tenderness throughout the abdomen but does not have peritoneal signs on exam.  She is afebrile vitals overall normal.  Will check abdominal labs, GI pathogen panel and C. difficile study although patient has not been on recent antibiotics.  Will also get CT abdomen pelvis given tenderness on exam.  IV fluid bolus given, pain medications offered but patient declines at this time reporting mild pain.  Lab evaluation significant for leukocytosis of 13.4, normal hemoglobin, potassium of 2.8 with normal magnesium, no other acute electrolyte derangements, normal renal and liver function normal lipase.  Urinalysis without signs of infection.  Lactic acid within normal limits.  Awaiting stool studies.  CT scan shows diffuse wall thickening within the colon consistent with colitis.  Will start patient on IV Cipro and Flagyl.  Patient given both p.o. and IV potassium replacement.  Given ongoing diarrhea with colitis as well as hypokalemia feel that patient will benefit from admission for continued rehydration, electrolyte replenishment and management of colitis.  Will consult for medicine admission.  Called and updated patient's daughter on plan and she is in agreement.  Case discussed with Dr. Isidoro Donning with [facility] hospitalist who will see and admit the patient.  Final Clinical Impressions(s) / ED Diagnoses   Final diagnoses:  Colitis  Diarrhea, unspecified type  Hypokalemia    ED Discharge Orders    None       Dartha LodgeFord, Colletta Spillers N, [place] JerseyPA-C 01/19/19 1622    Sabas SousBero, Michael M, MD 01/23/19 [phone]

## 2019-01-19 NOTE — Progress Notes (Addendum)
CBG this evening is 66. Apple juice provided, will recheck CBG in 15 min.   **CBG after 64min- 81. Asymptomatic**

## 2019-01-19 NOTE — ED Notes (Signed)
Bed: FS23 Expected date: 01/19/19 Expected time: 8:02 AM Means of arrival:  Comments: Medic [age] yo F Diarrhea

## 2019-01-19 NOTE — H&P (Addendum)
History and Physical        Hospital Admission Note Date: 01/19/2019  Patient name: Ashlee Reid Medical record number: [id number] Date of birth: 08-26-1925 Age: 85 y.o. Gender: female  PCP: Aura Dials, MD    Patient coming from: Home  I have reviewed all records in the [facility].    Chief Complaint:  Abdominal pain with diarrhea since last night  HPI: Patient is a 85 year old female with history of diabetes mellitus, Parkinson's, rheumatoid arthritis, stroke, depression presented to ED with multiple episodes of diarrhea that started around 2 AM in the morning.  History was obtained from the patient who stated that she was in her normal baseline health until around 2 AM in the morning when she started having multiple episodes of watery diarrhea.  Denied any hematochezia or melena.  She also reported abdominal pain 6/10, intermittent, periumbilical this morning.  No nausea or vomiting.  No fevers or chills. Patient reports that she has intermittent constipation and diarrhea issues but this is different.  She feels generalized weakness, dizziness and lightheadedness. No chest pain, cough, states that she has not been on any antibiotics recently. COVID-19 test negative  ED work-up/course:  Temp 97.5, pulse 58, BP 149/62, O2 sats 93% on room air Sodium 137, potassium 2.8, creatinine 0.7 Lactic acid 0.8 WBCs 13.4, hemoglobin 12.1  CT abdomen showed wall thickening of the colon most compatible with colitis   Review of Systems: Positives marked in 'bold' Constitutional: Denies fever, chills, diaphoresis, poor appetite and fatigue.  HEENT: Denies photophobia, eye pain, redness, hearing loss, ear pain, congestion, sore throat, rhinorrhea, sneezing, mouth sores, trouble swallowing, neck pain, neck stiffness and tinnitus.   Respiratory: Denies SOB, DOE, cough, chest  tightness,  and wheezing.   Cardiovascular: Denies chest pain, palpitations and leg swelling.  Gastrointestinal: Please see HPI Genitourinary: Denies dysuria, urgency, frequency, hematuria, flank pain and difficulty urinating.  Musculoskeletal: Denies myalgias, back pain, joint swelling, arthralgias and gait problem.  Skin: Denies pallor, rash and wound.  Neurological: Denies seizures, syncope, numbness and headaches. + Generalized weakness, fatigue, dizziness Hematological: Denies adenopathy. Easy bruising, personal or family bleeding history  Psychiatric/Behavioral: Denies suicidal ideation, mood changes, confusion, nervousness, sleep disturbance and agitation  Past Medical History: Past Medical History:  Diagnosis Date  . Depression   . Diabetes ([place])   . Diabetes ([place])   . Parkinson disease ([place])   . Peripheral neuropathy   . Rheumatoid arteritis ([place])   . Stroke [facility])     Past Surgical History:  Procedure Laterality Date  . ABDOMINAL HYSTERECTOMY    . APPENDECTOMY    . TONSILLECTOMY AND ADENOIDECTOMY      Medications: Prior to Admission medications   Medication Sig Start Date End Date Taking? Authorizing Provider  acetaminophen (TYLENOL) 500 MG tablet Take 500 mg by mouth every 8 (eight) hours as needed for mild pain.     [provider]  acetaminophen (TYLENOL) 650 MG CR tablet Take 650 mg by mouth daily with supper.     [provider]  aspirin 81 MG tablet Take 81 mg by mouth daily.    [provider]  Calcium Carbonate-Vitamin D (CALCIUM  600+D) 600-400 MG-UNIT tablet Take 1 tablet by mouth 2 (two) times daily.    [provider]  carbidopa-levodopa (SINEMET IR) 25-100 MG tablet Take 2 tablets by mouth 3 (three) times daily. 03/23/18   Levert FeinsteinYan, Yijun, MD  carvedilol (COREG) 3.125 MG tablet Take 3.125 mg by mouth 2 (two) times daily with a meal. 0800 & 1700    [provider]  celecoxib (CELEBREX) 100 MG capsule Take 100 mg by mouth  2 (two) times daily.  10/20/15   [provider]  escitalopram (LEXAPRO) 10 MG tablet Take 15 mg by mouth daily.    [provider]  gabapentin (NEURONTIN) 300 MG capsule Take 300 mg by mouth at bedtime.  10/01/15   [provider]  glipiZIDE (GLUCOTROL) 5 MG tablet Take 5 mg by mouth daily before breakfast.  10/23/15   [provider]  HYDROcodone-acetaminophen (NORCO/VICODIN) 5-325 MG tablet Take 1 tablet by mouth every 6 (six) hours as needed for severe pain. 10/25/17   Zadie RhineWickline, Donald, MD  hydroxychloroquine (PLAQUENIL) 200 MG tablet Take 200 mg by mouth daily.  11/10/15   [provider]  levothyroxine (SYNTHROID, LEVOTHROID) 25 MCG tablet Take 25 mcg by mouth daily before breakfast.  11/14/15   [provider]  Menthol, Topical Analgesic, (BIOFREEZE COLORLESS EX) 1 application by Other route every 4 (four) hours as needed (pain). Apply to 5% gel to the neck    [provider]  nitroGLYCERIN (NITROSTAT) 0.4 MG SL tablet Place 0.4 mg under the tongue every 5 (five) minutes as needed for chest pain.    [provider]  Omega-3 Fatty Acids (FISH OIL) 1000 MG CAPS Take 1,000 mg by mouth 2 (two) times daily.     [provider]  omeprazole (PRILOSEC) 20 MG capsule Take 20 mg by mouth 2 (two) times daily before a meal.  11/13/15   [provider]  Polyethyl Glycol-Propyl Glycol (SYSTANE) 0.4-0.3 % SOLN Place 1 drop into both eyes 2 (two) times daily as needed.    [provider]  polyethylene glycol (MIRALAX / GLYCOLAX) packet Take 17 g by mouth daily as needed for mild constipation or moderate constipation.    [provider]  potassium chloride (KLOR-CON) 20 MEQ packet Take 20 mEq by mouth daily.    [provider]  pravastatin (PRAVACHOL) 20 MG tablet Take 20 mg by mouth at bedtime.  11/10/15   [provider]    Allergies:   Allergies  Allergen Reactions  . Sulfa Antibiotics  Swelling    Social History:  reports that she has never smoked. Her smokeless tobacco use includes chew. She reports that she does not drink alcohol or use drugs.  Family History: Family History  Problem Relation Age of Onset  . Congestive Heart Failure Father   . Alzheimer's disease Mother   . Stroke Mother   . Diabetes Daughter     Physical Exam: Blood pressure (!) 144/62, pulse 60, temperature (!) 97.5 F (36.4 C), temperature source Oral, resp. rate (!) 26, height 5\' 3"  (1.6 m), weight 47.6 kg, SpO2 94 %. General: Alert, awake, oriented x3, in no acute distress. Eyes: pink conjunctiva,anicteric sclera, pupils equal and reactive to light and accomodation, HEENT: normocephalic, atraumatic, oropharynx clear Neck: supple, no masses or lymphadenopathy, no goiter, no bruits, no JVD CVS: Regular rate and rhythm, without murmurs, rubs or gallops. No lower extremity edema Resp : Clear to auscultation bilaterally, no wheezing, rales or rhonchi. GI : Soft, mild  diffuse tenderness, nondistended, positive bowel sounds, no masses. No hepatomegaly. No hernia.  Musculoskeletal: No clubbing or cyanosis, positive pedal pulses. No contracture. ROM intact  Neuro: Grossly intact, no focal neurological deficits, strength 5/5 upper and lower extremities bilaterally Psych: alert and oriented x 3, normal mood and affect Skin: no rashes or lesions, warm and dry   LABS on Admission: I have personally reviewed all the labs and imagings below    Basic Metabolic Panel: Recent Labs  Lab 01/19/19 0836  NA 137  K 2.8*  CL 100  CO2 26  GLUCOSE 99  BUN 22  CREATININE 0.72  CALCIUM 8.9   Liver Function Tests: Recent Labs  Lab 01/19/19 0836  AST 18  ALT 6  ALKPHOS 57  BILITOT 0.8  PROT 7.0  ALBUMIN 3.7   Recent Labs  Lab 01/19/19 0836  LIPASE 20   No results for input(s): AMMONIA in the last 168 hours. CBC: Recent Labs  Lab 01/19/19 0836  WBC 13.4*  HGB 12.1  HCT 38.2  MCV 95.7   PLT 273   Cardiac Enzymes: No results for input(s): CKTOTAL, CKMB, CKMBINDEX, TROPONINI in the last 168 hours. BNP: Invalid input(s): POCBNP CBG: No results for input(s): GLUCAP in the last 168 hours.  Radiological Exams on Admission:  Ct Abdomen Pelvis W Contrast  Result Date: 01/19/2019 CLINICAL DATA:  Multiple episodes of diarrhea. Diffuse abdominal pain. EXAM: CT ABDOMEN AND PELVIS WITH CONTRAST TECHNIQUE: Multidetector CT imaging of the abdomen and pelvis was performed using the standard protocol following bolus administration of intravenous contrast. CONTRAST:  100mL OMNIPAQUE IOHEXOL 300 MG/ML  SOLN COMPARISON:  None. FINDINGS: Lower chest: Normal heart size. Lung bases are clear. No pleural effusion. Hepatobiliary: Liver is normal in size and contour. Subcentimeter too small to characterize low-attenuation lesion hepatic dome (image 11; series 2). Multiple stones within the gallbladder lumen. No intrahepatic or extrahepatic biliary ductal dilatation. Pancreas: Atrophic Spleen: Unremarkable Adrenals/Urinary Tract: Normal adrenal glands. Kidneys enhance symmetrically with contrast. No hydronephrosis. Urinary bladder is unremarkable. Stomach/Bowel: Normal morphology of the stomach. No evidence for small bowel obstruction. No free fluid or free intraperitoneal air. There is wall thickening of the descending and sigmoid colon. Sigmoid colonic diverticulosis. Additionally, there is mild wall thickening of the cecum and ascending colon. Vascular/Lymphatic: Normal caliber abdominal aorta with peripheral calcified atherosclerotic plaque. No retroperitoneal lymphadenopathy. Reproductive: Status post hysterectomy. Small amount of gas with vaginal cuff. Other: None. Musculoskeletal: No aggressive or acute appearing osseous lesions. IMPRESSION: There is wall thickening of the colon most compatible with colitis. Small amount of gas within the vagina which is nonspecific in etiology. Recommend clinical  correlation to exclude the possibility colovaginal fistula. Electronically Signed   By: Annia Beltrew  Davis M.D.   On: 01/19/2019 10:17      EKG: Independently reviewed. *Rate 64, normal sinus rhythm QTc 641   Assessment/Plan Principal Problem: Acute colitis -Presenting with multiple episodes of diarrhea, dehydration.  CT abdomen showed wall thickening of the descending, sigmoid colon, cecum and ascending colon. -Placed on IV fluids, clear liquid diet, IV Rocephin and Flagyl.  Patient received 1 dose of ciprofloxacin in the ED, avoid fluoroquinolones due to prolonged QTC -Follow C. difficile PCR that was ordered in ED but denies any recent antibiotic use  Active Problems:   Parkinsonism (HCC) -Continue Sinemet  Dehydration with hypokalemia -Replaced IV and oral, follow bmet    Prolonged QT interval -Avoid QTC prolonging drugs, avoid fluoroquinolones, hold Lexapro. -EKG in a.m. -Replace potassium to the  goal >4, magnesium above 2  DVT prophylaxis: Heparin subcu  CODE STATUS: Full CODE STATUS, discussed with the patient  Consults called: None  Family Communication: Admission, patients condition and plan of care including tests being ordered have been discussed with the patient who indicates understanding and agree with the plan and Code Status  Admission status: Inpatient, MedSurg  Disposition plan: Further plan will depend as patient's clinical course evolves and further radiologic and laboratory data become available.    At the time of admission, it appears that the appropriate admission status for this patient is INPATIENT . This is judged to be reasonable and necessary in order to provide the required intensity of service to ensure the patient's safety given the presenting symptoms dehydration, multiple episodes of diarrhea, acute colitis, physical exam findings, and initial radiographic and laboratory data in the context of their chronic comorbidities.  The medical decision  making on this patient was of high complexity and the patient is at high risk for clinical deterioration, therefore this is a level 3 visit.   Time Spent on Admission: 60 minutes      M.D. [facility] 01/19/2019, 12:01 PM

## 2019-01-19 NOTE — ED Triage Notes (Signed)
BIB Medic 41 with 6 episodes of Diarrhea since 0200. No other symptoms reported other than "a little weak". She is A & O x4.  VS 162/68-64-96% RA CBG 109 Temp 97.7

## 2019-01-19 NOTE — ED Notes (Signed)
ED TO INPATIENT HANDOFF REPORT  Name/Age/Gender Ashlee Reid 85 y.o. female  Code Status   Home/SNF/Other Home  Chief Complaint diarrhea  Level of Care/Admitting Diagnosis ED Disposition    ED Disposition Condition Comment   Admit  Hospital Area: [facility] [[id number]]  Level of Care: Med-Surg [16]  Covid Evaluation: Screening Protocol (No Symptoms)  Diagnosis: Colitis [622633]  Admitting Physician: RAI, Delene Ruffini [[id number]]  Attending Physician: RAI, RIPUDEEP K [[id number]]  Estimated length of stay: past midnight tomorrow  Certification:: I certify this patient will need inpatient services for at least 2 midnights  PT Class (Do Not Modify): Inpatient [101]  PT Acc Code (Do Not Modify): Private [1]       Medical History Past Medical History:  Diagnosis Date  . Depression   . Diabetes (HCC)   . Diabetes (HCC)   . Parkinson disease (HCC)   . Peripheral neuropathy   . Rheumatoid arteritis (HCC)   . Stroke [facility])     Allergies Allergies  Allergen Reactions  . Sulfa Antibiotics Swelling    IV Location/Drains/Wounds Patient Lines/Drains/Airways Status   Active Line/Drains/Airways    Name:   Placement date:   Placement time:   Site:   Days:   Peripheral IV 01/19/19 Left;Distal Forearm   01/19/19    0835    Forearm   less than 1          Labs/Imaging Results for orders placed or performed during the hospital encounter of 01/19/19 (from the past 48 hour(s))  Urinalysis, Routine w reflex microscopic     Status: Abnormal   Collection Time: 01/19/19  8:20 AM  Result Value Ref Range   Color, Urine YELLOW YELLOW   APPearance HAZY (A) CLEAR   Specific Gravity, Urine 1.017 1.005 - 1.030   pH 5.0 5.0 - 8.0   Glucose, UA NEGATIVE NEGATIVE mg/dL   Hgb urine dipstick NEGATIVE NEGATIVE   Bilirubin Urine NEGATIVE NEGATIVE   Ketones, ur 5 (A) NEGATIVE mg/dL   Protein, ur NEGATIVE NEGATIVE mg/dL   Nitrite NEGATIVE NEGATIVE   Leukocytes,Ua NEGATIVE  NEGATIVE    Comment: Performed at [facility], [address], [place], [place] [address]  Lipase, blood     Status: None   Collection Time: 01/19/19  8:36 AM  Result Value Ref Range   Lipase 20 11 - 51 U/L    Comment: Performed at [facility], [address] [place], [place] [address]  Comprehensive metabolic panel     Status: Abnormal   Collection Time: 01/19/19  8:36 AM  Result Value Ref Range   Sodium 137 135 - 145 mmol/L   Potassium 2.8 (L) 3.5 - 5.1 mmol/L   Chloride 100 98 - 111 mmol/L   CO2 26 22 - 32 mmol/L   Glucose, Bld 99 70 - 99 mg/dL   BUN 22 8 - 23 mg/dL   Creatinine, Ser 9.37 0.44 - 1.00 mg/dL   Calcium 8.9 8.9 - 34.2 mg/dL   Total Protein 7.0 6.5 - 8.1 g/dL   Albumin 3.7 3.5 - 5.0 g/dL   AST 18 15 - 41 U/L   ALT 6 0 - 44 U/L   Alkaline Phosphatase 57 38 - 126 U/L   Total Bilirubin 0.8 0.3 - 1.2 mg/dL   GFR calc non Af Amer >60 >60 mL/min   GFR calc Af Amer >60 >60 mL/min   Anion gap 11 5 - 15    Comment: Performed at [facility], [address], [place], [place] [address]  CBC     Status: Abnormal   Collection Time: 01/19/19  8:36 AM  Result Value Ref Range   WBC 13.4 (H) 4.0 - 10.5 K/uL   RBC 3.99 3.87 - 5.11 MIL/uL   Hemoglobin 12.1 12.0 - 15.0 g/dL   HCT 38.9 37.3 - 42.8 %   MCV 95.7 80.0 - 100.0 fL   MCH 30.3 26.0 - 34.0 pg   MCHC 31.7 30.0 - 36.0 g/dL   RDW 76.8 11.5 - 72.6 %   Platelets 273 150 - 400 K/uL   nRBC 0.0 0.0 - 0.2 %    Comment: Performed at [facility], [address] [place], [place] [address]  Lactic acid, plasma     Status: None   Collection Time: 01/19/19  8:36 AM  Result Value Ref Range   Lactic Acid, Venous 0.8 0.5 - 1.9 mmol/L    Comment: Performed at [facility], [address], [place], [place] [address]  Magnesium     Status: None   Collection Time: 01/19/19  8:36 AM  Result Value Ref Range   Magnesium 1.8 1.7 - 2.4 mg/dL     Comment: Performed at [facility], [address] [place], [place] [address]   Ct Abdomen Pelvis W Contrast  Result Date: 01/19/2019 CLINICAL DATA:  Multiple episodes of diarrhea. Diffuse abdominal pain. EXAM: CT ABDOMEN AND PELVIS WITH CONTRAST TECHNIQUE: Multidetector CT imaging of the abdomen and pelvis was performed using the standard protocol following bolus administration of intravenous contrast. CONTRAST:  OMNIPAQUE IOHEXOL 300 MG/ML  SOLN COMPARISON:  None. FINDINGS: Lower chest: Normal heart size. Lung bases are clear. No pleural effusion. Hepatobiliary: Liver is normal in size and contour. Subcentimeter too small to characterize low-attenuation lesion hepatic dome (image 11; series 2). Multiple stones within the gallbladder lumen. No intrahepatic or extrahepatic biliary ductal dilatation. Pancreas: Atrophic Spleen: Unremarkable Adrenals/Urinary Tract: Normal adrenal glands. Kidneys enhance symmetrically with contrast. No hydronephrosis. Urinary bladder is unremarkable. Stomach/Bowel: Normal morphology of the stomach. No evidence for small bowel obstruction. No free fluid or free intraperitoneal air. There is wall thickening of the descending and sigmoid colon. Sigmoid colonic diverticulosis. Additionally, there is mild wall thickening of the cecum and ascending colon. Vascular/Lymphatic: Normal caliber abdominal aorta with peripheral calcified atherosclerotic plaque. No retroperitoneal lymphadenopathy. Reproductive: Status post hysterectomy. Small amount of gas with vaginal cuff. Other: None. Musculoskeletal: No aggressive or acute appearing osseous lesions. IMPRESSION: There is wall thickening of the colon most compatible with colitis. Small amount of gas within the vagina which is nonspecific in etiology. Recommend clinical correlation to exclude the possibility colovaginal fistula. Electronically Signed   By: Annia Belt M.D.   On: 01/19/2019 10:17    Pending  Labs Unresulted Labs (From admission, onward)    Start     Ordered   01/19/19 1600  Potassium  Once,   R     01/19/19 1212   01/19/19 1024  SARS Coronavirus 2 (CEPHEID - Performed in [facility] hospital lab), Hosp Order  (Asymptomatic Patients Labs)  Once,   R    Question:  Rule Out  Answer:  Yes   01/19/19 1023   01/19/19 0823  Gastrointestinal Panel by PCR , Stool  (Gastrointestinal Panel by PCR, Stool)  Once,   R     01/19/19 0822   01/19/19 0822  C difficile quick scan w PCR reflex  (C  Difficile quick screen w PCR reflex panel)  Once, for 24 hours,   R     01/19/19 0822   Signed and Held  Hemoglobin A1c  Once,   R     Signed and Held   Signed and Held  Basic metabolic panel  Tomorrow morning,   R     Signed and Held   Signed and Held  CBC  Tomorrow morning,   R     Signed and Held   Signed and Held  CBC  (heparin)  Once,   R    Comments:  Baseline for heparin therapy IF NOT ALREADY DRAWN.  Notify MD if PLT < 100 K.    Signed and Held   Signed and Held  Creatinine, serum  (heparin)  Once,   R    Comments:  Baseline for heparin therapy IF NOT ALREADY DRAWN.    Signed and Held          Vitals/Pain Today's Vitals   01/19/19 0930 01/19/19 1130 01/19/19 1137 01/19/19 1200  BP: (!) 158/60 (!) 144/62 (!) 144/62 138/60  Pulse: (!) 57 60 60 (!) 58  Resp: (!) 21 (!) 22 (!) 26 (!) 22  Temp:    97.8 F (36.6 C)  TempSrc:    Oral  SpO2: 93% 93% 94% 93%  Weight:      Height:      PainSc:    0-No pain    Isolation Precautions Enteric precautions (UV disinfection)  Medications Medications  sodium chloride (PF) 0.9 % injection (0 mLs  Hold 01/19/19 0940)  metroNIDAZOLE (FLAGYL) IVPB 500 mg (500 mg Intravenous New Bag/Given 01/19/19 1204)  0.9 %  sodium chloride infusion ( Intravenous New Bag/Given 01/19/19 1205)  sodium chloride 0.9 % bolus 500 mL (0 mLs Intravenous Stopped 01/19/19 1203)  iohexol (OMNIPAQUE) 300 MG/ML solution 100 mL (100 mLs Intravenous Contrast Given 01/19/19  0941)  potassium chloride SA (K-DUR) CR tablet 40 mEq (40 mEq Oral Given 01/19/19 1107)  potassium chloride 10 mEq in 100 mL IVPB (0 mEq Intravenous Stopped 01/19/19 1203)  ciprofloxacin (CIPRO) IVPB 400 mg (0 mg Intravenous Stopped 01/19/19 1202)    Mobility walks with device

## 2019-01-20 LAB — GLUCOSE, CAPILLARY
Glucose-Capillary: 68 mg/dL — ABNORMAL LOW (ref 70–99)
Glucose-Capillary: 94 mg/dL (ref 70–99)
Glucose-Capillary: 136 mg/dL — ABNORMAL HIGH (ref 70–99)
Glucose-Capillary: 118 mg/dL — ABNORMAL HIGH (ref 70–99)

## 2019-01-20 LAB — BASIC METABOLIC PANEL
Anion gap: 8 (ref 5–15)
Anion gap: 9 (ref 5–15)
BUN: 12 mg/dL (ref 8–23)
BUN: 9 mg/dL (ref 8–23)
CO2: 24 mmol/L (ref 22–32)
CO2: 25 mmol/L (ref 22–32)
Calcium: 8.1 mg/dL — ABNORMAL LOW (ref 8.9–10.3)
Calcium: 8.4 mg/dL — ABNORMAL LOW (ref 8.9–10.3)
Chloride: 107 mmol/L (ref 98–111)
Chloride: 105 mmol/L (ref 98–111)
Creatinine, Ser: 0.64 mg/dL (ref 0.44–1.00)
Creatinine, Ser: 0.69 mg/dL (ref 0.44–1.00)
GFR calc Af Amer: >60 mL/min (ref >60)
GFR calc Af Amer: >60 mL/min (ref >60)
GFR calc non Af Amer: >60 mL/min (ref >60)
GFR calc non Af Amer: >60 mL/min (ref >60)
Glucose, Bld: 68 mg/dL — ABNORMAL LOW (ref 70–99)
Glucose, Bld: 128 mg/dL — ABNORMAL HIGH (ref 70–99)
Potassium: 2.6 mmol/L — CL (ref 3.5–5.1)
Potassium: 3.3 mmol/L — ABNORMAL LOW (ref 3.5–5.1)
Sodium: 139 mmol/L (ref 135–145)
Sodium: 139 mmol/L (ref 135–145)

## 2019-01-20 LAB — CBC
HCT: 30.3 % — ABNORMAL LOW (ref 36.0–46.0)
Hemoglobin: 9.9 g/dL — ABNORMAL LOW (ref 12.0–15.0)
MCH: 31.7 pg (ref 26.0–34.0)
MCHC: 32.7 g/dL (ref 30.0–36.0)
MCV: 97.1 fL (ref 80.0–100.0)
Platelets: 241 10*3/uL (ref 150–400)
RBC: 3.12 MIL/uL — ABNORMAL LOW (ref 3.87–5.11)
RDW: 13 % (ref 11.5–15.5)
WBC: 7.6 10*3/uL (ref 4.0–10.5)
nRBC: 0 % (ref 0.0–0.2)

## 2019-01-20 LAB — MAGNESIUM: Magnesium: 2.3 mg/dL (ref 1.7–2.4)

## 2019-01-20 MED ORDER — POTASSIUM CHLORIDE 10 MEQ/100ML IV SOLN
10.0000 meq | INTRAVENOUS | Status: AC
  Administered 2019-01-20 (×2): 10 meq via INTRAVENOUS
  Filled 2019-01-20: qty 100

## 2019-01-20 MED ORDER — POTASSIUM CHLORIDE 20 MEQ PO PACK
40.0000 meq | PACK | Freq: Once | ORAL | Status: AC
  Administered 2019-01-20: 40 meq via ORAL
  Filled 2019-01-20: qty 2

## 2019-01-20 MED ORDER — MAGNESIUM SULFATE 2 GM/50ML IV SOLN
2.0000 g | Freq: Once | INTRAVENOUS | Status: AC
  Administered 2019-01-20: 2 g via INTRAVENOUS
  Filled 2019-01-20: qty 50

## 2019-01-20 NOTE — Progress Notes (Addendum)
PROGRESS NOTE    Ashlee Reid  XUX:[id number]  DOB: 1926/08/12  DOA: 01/19/2019 PCP: Henrine Screws, MD  Brief Narrative:  85 year old female with history of diabetes mellitus, Parkinson's, rheumatoid arthritis, stroke, depression presented to ED with weakness, abdominal pain, poor appetite, multiple episodes of diarrhea (has history of intermittent constipation and diarrhea) without hematochezia or melena.  CT abdomen obtained in the ED showed cecal, descending/sigmoid/ascending colon wall thickening suggesting colitis.  Stool C. difficile was ordered but patient's diarrhea resolved before sample could be sent.  Subjective:  Patient states diarrhea improving and and wants to try upgraded diet.  Objective: Vitals:   01/19/19 1300 01/19/19 1338 01/19/19 2139 01/20/19 0635  BP: (!) 120/54 (!) 154/62 (!) 136/45 (!) 129/51  Pulse: (!) 55 (!) 57 (!) 57 (!) 52  Resp: 17 17 19 18   Temp:  98.4 F (36.9 C) 98.3 F (36.8 C) 97.7 F (36.5 C)  TempSrc:  Oral Oral Oral  SpO2: 92% 96% 95% 95%  Weight:      Height:        Intake/Output Summary (Last 24 hours) at 01/20/2019 1131 Last data filed at 01/20/2019 0900 Gross per 24 hour  Intake 2138.82 ml  Output -  Net 2138.82 ml   Filed Weights   01/19/19 0822  Weight: 47.6 kg    Physical Examination:  General exam: Appears calm and comfortable  Respiratory system: Clear to auscultation. Respiratory effort normal. Cardiovascular system: S1 & S2 heard, RRR. No JVD, murmurs, rubs, gallops or clicks. No pedal edema. Gastrointestinal system: Abdomen is nondistended, soft and mild lower abdominal tenderness without rebound or guarding noted. No organomegaly or masses felt. Normal bowel sounds heard. Central nervous system: Alert and oriented. No focal neurological deficits. Extremities: Symmetric 5 x 5 power. Skin: No rashes, lesions or ulcers Psychiatry: Judgement and insight appear normal. Mood & affect appropriate.     Data  Reviewed: I have personally reviewed following labs and imaging studies  CBC: Recent Labs  Lab 01/19/19 0836 01/19/19 1617 01/20/19 0611  WBC 13.4* 10.3 7.6  HGB 12.1 11.1* 9.9*  HCT 38.2 35.1* 30.3*  MCV 95.7 96.4 97.1  PLT 273 282 241   Basic Metabolic Panel: Recent Labs  Lab 01/19/19 0836 01/19/19 1617 01/20/19 0611  NA 137  --  139  K 2.8* 3.0* 2.6*  CL 100  --  107  CO2 26  --  24  GLUCOSE 99  --  68*  BUN 22  --  12  CREATININE 0.72 0.69 0.64  CALCIUM 8.9  --  8.1*  MG 1.8  --   --    GFR: Estimated Creatinine Clearance: 34.4 mL/min (by C-G formula based on SCr of 0.64 mg/dL). Liver Function Tests: Recent Labs  Lab 01/19/19 0836  AST 18  ALT 6  ALKPHOS 57  BILITOT 0.8  PROT 7.0  ALBUMIN 3.7   Recent Labs  Lab 01/19/19 0836  LIPASE 20   No results for input(s): AMMONIA in the last 168 hours. Coagulation Profile: No results for input(s): INR, PROTIME in the last 168 hours. Cardiac Enzymes: No results for input(s): CKTOTAL, CKMB, CKMBINDEX, TROPONINI in the last 168 hours. BNP (last 3 results) No results for input(s): PROBNP in the last 8760 hours. HbA1C: Recent Labs    01/19/19 1617  HGBA1C 6.3*   CBG: Recent Labs  Lab 01/19/19 2136 01/19/19 2301 01/20/19 0727  GLUCAP 66* 81 68*   Lipid Profile: No results for input(s): CHOL, HDL, LDLCALC, TRIG, CHOLHDL,  LDLDIRECT in the last 72 hours. Thyroid Function Tests: No results for input(s): TSH, T4TOTAL, FREET4, T3FREE, THYROIDAB in the last 72 hours. Anemia Panel: No results for input(s): VITAMINB12, FOLATE, FERRITIN, TIBC, IRON, RETICCTPCT in the last 72 hours. Sepsis Labs: Recent Labs  Lab 01/19/19 0836  LATICACIDVEN 0.8    Recent Results (from the past 240 hour(s))  SARS Coronavirus 2 (CEPHEID - Performed in [facility] hospital lab), Hosp Order     Status: None   Collection Time: 01/19/19 10:24 AM  Result Value Ref Range Status   SARS Coronavirus 2 NEGATIVE NEGATIVE Final     Comment: (NOTE) If result is NEGATIVE SARS-CoV-2 target nucleic acids are NOT DETECTED. The SARS-CoV-2 RNA is generally detectable in upper and lower  respiratory specimens during the acute phase of infection. The lowest  concentration of SARS-CoV-2 viral copies this assay can detect is 250  copies / mL. A negative result does not preclude SARS-CoV-2 infection  and should not be used as the sole basis for treatment or other  patient management decisions.  A negative result may occur with  improper specimen collection / handling, submission of specimen other  than nasopharyngeal swab, presence of viral mutation(s) within the  areas targeted by this assay, and inadequate number of viral copies  (<250 copies / mL). A negative result must be combined with clinical  observations, patient history, and epidemiological information. If result is POSITIVE SARS-CoV-2 target nucleic acids are DETECTED. The SARS-CoV-2 RNA is generally detectable in upper and lower  respiratory specimens dur ing the acute phase of infection.  Positive  results are indicative of active infection with SARS-CoV-2.  Clinical  correlation with patient history and other diagnostic information is  necessary to determine patient infection status.  Positive results do  not rule out bacterial infection or co-infection with other viruses. If result is PRESUMPTIVE POSTIVE SARS-CoV-2 nucleic acids MAY BE PRESENT.   A presumptive positive result was obtained on the submitted specimen  and confirmed on repeat testing.  While 2019 novel coronavirus  (SARS-CoV-2) nucleic acids may be present in the submitted sample  additional confirmatory testing may be necessary for epidemiological  and / or clinical management purposes  to differentiate between  SARS-CoV-2 and other Sarbecovirus currently known to infect humans.  If clinically indicated additional testing with an alternate test  methodology [phone]) is advised. The SARS-CoV-2  RNA is generally  detectable in upper and lower respiratory sp ecimens during the acute  phase of infection. The expected result is Negative. Fact Sheet for Patients:  StrictlyIdeas.no Fact Sheet for Healthcare Providers: BankingDealers.co.za This test is not yet approved or cleared by the [place] FDA and has been authorized for detection and/or diagnosis of SARS-CoV-2 by FDA under an Emergency Use Authorization (EUA).  This EUA will remain in effect (meaning this test can be used) for the duration of the COVID-19 declaration under Section 564(b)(1) of the Act, 21 U.S.C. section 360bbb-3(b)(1), unless the authorization is terminated or revoked sooner. Performed at [facility], [place] [address] [place], [place] [address]       Radiology Studies: Ct Abdomen Pelvis W Contrast  Result Date: 01/19/2019 CLINICAL DATA:  Multiple episodes of diarrhea. Diffuse abdominal pain. EXAM: CT ABDOMEN AND PELVIS WITH CONTRAST TECHNIQUE: Multidetector CT imaging of the abdomen and pelvis was performed using the standard protocol following bolus administration of intravenous contrast. CONTRAST:  176mL OMNIPAQUE IOHEXOL 300 MG/ML  SOLN COMPARISON:  None. FINDINGS: Lower chest: Normal heart size.  Lung bases are clear. No pleural effusion. Hepatobiliary: Liver is normal in size and contour. Subcentimeter too small to characterize low-attenuation lesion hepatic dome (image 11; series 2). Multiple stones within the gallbladder lumen. No intrahepatic or extrahepatic biliary ductal dilatation. Pancreas: Atrophic Spleen: Unremarkable Adrenals/Urinary Tract: Normal adrenal glands. Kidneys enhance symmetrically with contrast. No hydronephrosis. Urinary bladder is unremarkable. Stomach/Bowel: Normal morphology of the stomach. No evidence for small bowel obstruction. No free fluid or free intraperitoneal air. There is wall thickening of the descending and  sigmoid colon. Sigmoid colonic diverticulosis. Additionally, there is mild wall thickening of the cecum and ascending colon. Vascular/Lymphatic: Normal caliber abdominal aorta with peripheral calcified atherosclerotic plaque. No retroperitoneal lymphadenopathy. Reproductive: Status post hysterectomy. Small amount of gas with vaginal cuff. Other: None. Musculoskeletal: No aggressive or acute appearing osseous lesions. IMPRESSION: There is wall thickening of the colon most compatible with colitis. Small amount of gas within the vagina which is nonspecific in etiology. Recommend clinical correlation to exclude the possibility colovaginal fistula. Electronically Signed   By: Annia Belt M.D.   On: 01/19/2019 10:17        Scheduled Meds: . carbidopa-levodopa  2 tablet Oral TID  . gabapentin  300 mg Oral QHS  . heparin  5,000 Units Subcutaneous Q8H  . insulin aspart  0-5 Units Subcutaneous QHS  . insulin aspart  0-9 Units Subcutaneous TID WC  . potassium chloride  20 mEq Oral Daily   Continuous Infusions: . sodium chloride 75 mL/hr at 01/20/19 0444  . cefTRIAXone (ROCEPHIN)  IV Stopped (01/19/19 1602)    Assessment & Plan:    1.  Acute colitis: Continue IV Rocephin, Flagyl.  DC IV fluids as patient tolerating liquid diet well.  Will advance to soft diet today and monitor.  She did report abdominal cramping while I was in the room and requested help to go to the bathroom.  Will DC stool for C. difficile testing if she has semi-formed or formed stools today.  2.  Hypokalemia: Secondary to problem #1/diarrhea and dilutional from IV fluids.  Replace potassium and magnesium, repeat levels this afternoon  3.  Prolonged QTC: Avoid fluoroquinolones and replace potassium/magnesium as discussed above  4.  Parkinsonism: Resume Sinemet.  5.  Weakness/dizziness: She may have orthostasis secondary to problem #1 and in the setting of problem #4.  Check orthostatics.  6.  Diabetes mellitus: On sliding  scale insulin.  Resume home regimen if and when diet intake consistent.   DVT prophylaxis: Heparin Code Status: Full code Family / Patient Communication: Discussed with patient Disposition Plan: Home when medically stable  Upgraded to telemetry given severe hypokalemia/potassium at 2.6   LOS: 1 day    Time spent: 35 minutes    Alessandra Bevels, MD [facility] Pager [phone]  If 7PM-7AM, please contact night-coverage [email or web address] Password [facility] 01/20/2019, 11:31 AM

## 2019-01-21 LAB — GLUCOSE, CAPILLARY
Glucose-Capillary: 77 mg/dL (ref 70–99)
Glucose-Capillary: 178 mg/dL — ABNORMAL HIGH (ref 70–99)
Glucose-Capillary: 107 mg/dL — ABNORMAL HIGH (ref 70–99)
Glucose-Capillary: 137 mg/dL — ABNORMAL HIGH (ref 70–99)

## 2019-01-21 LAB — BASIC METABOLIC PANEL
Anion gap: 9 (ref 5–15)
BUN: 7 mg/dL — ABNORMAL LOW (ref 8–23)
CO2: 23 mmol/L (ref 22–32)
Calcium: 8.3 mg/dL — ABNORMAL LOW (ref 8.9–10.3)
Chloride: 108 mmol/L (ref 98–111)
Creatinine, Ser: 0.63 mg/dL (ref 0.44–1.00)
GFR calc Af Amer: >60 mL/min (ref >60)
GFR calc non Af Amer: >60 mL/min (ref >60)
Glucose, Bld: 147 mg/dL — ABNORMAL HIGH (ref 70–99)
Potassium: 3.3 mmol/L — ABNORMAL LOW (ref 3.5–5.1)
Sodium: 140 mmol/L (ref 135–145)

## 2019-01-21 MED ORDER — CEFDINIR 300 MG PO CAPS
300.0000 mg | ORAL_CAPSULE | Freq: Two times a day (BID) | ORAL | Status: DC
  Administered 2019-01-22 – 2019-01-24 (×5): 300 mg via ORAL
  Filled 2019-01-21 (×5): qty 1

## 2019-01-21 MED ORDER — POTASSIUM CHLORIDE 20 MEQ PO PACK
20.0000 meq | PACK | Freq: Once | ORAL | Status: AC
  Administered 2019-01-21: 20 meq via ORAL
  Filled 2019-01-21: qty 1

## 2019-01-21 MED ORDER — CEFDINIR 300 MG PO CAPS: 300.0000 mg | ORAL_CAPSULE | Freq: Two times a day (BID) | ORAL | Status: DC

## 2019-01-21 MED ORDER — METRONIDAZOLE 500 MG PO TABS
500.0000 mg | ORAL_TABLET | Freq: Three times a day (TID) | ORAL | Status: DC
  Administered 2019-01-21 – 2019-01-24 (×10): 500 mg via ORAL
  Filled 2019-01-21 (×11): qty 1

## 2019-01-21 MED ORDER — POTASSIUM CHLORIDE 20 MEQ PO PACK: 40.0000 meq | PACK | Freq: Once | ORAL | Status: DC

## 2019-01-21 NOTE — Evaluation (Signed)
Physical Therapy Evaluation Patient Details Name: Ashlee Reid MRN: [id number] DOB: [birth year] Today's Date: 01/21/2019   History of Present Illness  85 yo female admitted with colitis, hypokalemia. Hx of Parkinson's, RA, DM, CVA  Clinical Impression  On eval, pt required Max-Total assist for bed mobility and Mod assist to stand. Pt was not agreeable to taking any pivotal or ambulatory steps. Noted bradykinesia and rigidity. Pt is weak as well. Discussed d/c plan-explained SNF vs HHPT. Pt stated she prefers to return home. Encouraged pt to talk with family to ensure that d/c plan will be appropriate. At this time, recommendation is for SNF for rehab. If pt chooses to return home, recommend HHPT/HHOT.     Follow Up Recommendations SNF    Equipment Recommendations  None recommended by PT    Recommendations for Other Services       Precautions / Restrictions Precautions Precautions: Fall Restrictions Weight Bearing Restrictions: No      Mobility  Bed Mobility Overal bed mobility: Needs Assistance Bed Mobility: Supine to Sit;Sit to Supine     Supine to sit: HOB elevated;Max assist Sit to supine: HOB elevated;Total assist   General bed mobility comments: Max assist for supine>sit; Total assist for sit>supine. Increased time. Utilized bedpad for scooting, positioning. Very little assist from pt.   Transfers Overall transfer level: Needs assistance Equipment used: 4-wheeled walker Transfers: Sit to/from Stand Sit to Stand: Mod assist         General transfer comment: Assist for anterior weight shift, power up, stabilization in standing, controlled descent. Posterior bias. Pt is rigid.   Ambulation/Gait             General Gait Details: NT-pt unwillling/unable to attempt  Stairs            Wheelchair Mobility    Modified Rankin (Stroke Patients Only)       Balance Overall balance assessment: History of Falls;Needs assistance           Standing  balance-Leahy Scale: Poor                               Pertinent Vitals/Pain Pain Assessment: Faces Faces Pain Scale: Hurts little more Pain Location: chest, shoulders Pain Descriptors / Indicators: Sore;Aching Pain Intervention(s): Limited activity within patient's tolerance;Repositioned    Home Living Family/patient expects to be discharged to:: Private residence Living Arrangements: Children Available Help at Discharge: Family(cna: 8-12; 6-10  5 days/week) Type of Home: House Home Access: Stairs to enter   [employer] of Steps: 2 Home Layout: One level Home Equipment: Environmental consultant - 4 wheels      Prior Function Level of Independence: Needs assistance   Gait / Transfers Assistance Needed: uses rollator for short household distances  ADL's / Homemaking Assistance Needed: assist with bathing, dressing        Hand Dominance        Extremity/Trunk Assessment   Upper Extremity Assessment Upper Extremity Assessment: Generalized weakness    Lower Extremity Assessment Lower Extremity Assessment: LLE deficits/detail;RLE deficits/detail RLE Deficits / Details: DF/PF 2/5, hip flex 2/5. Bradykinesia RLE Coordination: decreased gross motor;decreased fine motor LLE Deficits / Details: DF/PF 2/5, hip flex 2/5. Bradykinesia LLE Coordination: decreased fine motor;decreased gross motor    Cervical / Trunk Assessment Cervical / Trunk Assessment: Kyphotic  Communication      Cognition Arousal/Alertness: Awake/alert Behavior During Therapy: WFL for tasks assessed/performed Overall Cognitive Status: Within Functional Limits for tasks  assessed                                        General Comments      Exercises     Assessment/Plan    PT Assessment Patient needs continued PT services  PT Problem List Decreased strength;Decreased balance;Decreased mobility;Decreased activity tolerance;Decreased knowledge of use of DME;Pain       PT  Treatment Interventions DME instruction;Functional mobility training;Balance training;Patient/family education;Therapeutic activities;Gait training;Therapeutic exercise    PT Goals (Current goals can be found in the Care Plan section)  Acute Rehab PT Goals Patient Stated Goal: home PT Goal Formulation: With patient Time For Goal Achievement: 02/04/19 Potential to Achieve Goals: Fair    Frequency Min 2X/week   Barriers to discharge        Co-evaluation               AM-PAC PT "6 Clicks" Mobility  Outcome Measure Help needed turning from your back to your side while in a flat bed without using bedrails?: Total Help needed moving from lying on your back to sitting on the side of a flat bed without using bedrails?: Total Help needed moving to and from a bed to a chair (including a wheelchair)?: Total Help needed standing up from a chair using your arms (e.g., wheelchair or bedside chair)?: Total Help needed to walk in hospital room?: Total Help needed climbing 3-5 steps with a railing? : Total 6 Click Score: 6    End of Session Equipment Utilized During Treatment: Gait belt Activity Tolerance: Patient tolerated treatment well Patient left: in bed;with call bell/phone within reach;with bed alarm set   PT Visit Diagnosis: Muscle weakness (generalized) (M62.81);Difficulty in walking, not elsewhere classified (R26.2);History of falling (Z91.81);Other abnormalities of gait and mobility (R26.89)    Time: 7628-3151 PT Time Calculation (min) (ACUTE ONLY): 34 min   Charges:   PT Evaluation $PT Eval Moderate Complexity: 1 Mod PT Treatments $Therapeutic Activity: 8-22 mins          Weston Anna, PT Acute Rehabilitation Services Pager: [phone] Office: [phone]

## 2019-01-21 NOTE — Progress Notes (Addendum)
PROGRESS NOTE    Ashlee Reid  URK:[id number]  DOB: 02-29-1926  DOA: 01/19/2019 PCP: Henrine Screws, MD  Brief Narrative:  85 year old female with history of diabetes mellitus, Parkinson's, rheumatoid arthritis, stroke, depression presented to ED with weakness, abdominal pain, poor appetite, multiple episodes of diarrhea (has history of intermittent constipation and diarrhea) without hematochezia or melena.  CT abdomen obtained in the ED showed cecal, descending/sigmoid/ascending colon wall thickening suggesting colitis.  Stool C. difficile was ordered but patient's diarrhea resolved before sample could be sent.  Subjective:  Patient reports resolution of diarrhea and tolerating diet well.  She apparently uses a Rollator walker at baseline and lives with daughter and son-in-law.  She has not been out of bed while hospitalized here.  She also receives help through a CNA about 4 hours in the morning and 4 hours in the evening at home.  Objective: Vitals:   01/20/19 1449 01/20/19 2041 01/21/19 0536 01/21/19 1426  BP: (!) 153/62 (!) 151/50 (!) 170/60 (!) 120/58  Pulse: (!) 55 (!) 55 (!) 54 66  Resp: 16 17 19 16   Temp: 98 F (36.7 C) 98.2 F (36.8 C) 98 F (36.7 C) 98 F (36.7 C)  TempSrc: Oral Oral Oral Oral  SpO2: 95% 94% 96% 97%  Weight:      Height:        Intake/Output Summary (Last 24 hours) at 01/21/2019 1617 Last data filed at 01/21/2019 1426 Gross per 24 hour  Intake 480 ml  Output 1450 ml  Net -970 ml   Filed Weights   01/19/19 0822  Weight: 47.6 kg    Physical Examination:  General exam: Appears calm and comfortable  Respiratory system: Clear to auscultation. Respiratory effort normal. Cardiovascular system: S1 & S2 heard, RRR. No JVD, murmurs, rubs, gallops or clicks. No pedal edema. Gastrointestinal system: Abdomen is nondistended, soft and mild lower abdominal tenderness without rebound or guarding noted. No organomegaly or masses felt. Normal bowel sounds  heard. Central nervous system: Alert and oriented. No focal neurological deficits. Extremities: Symmetric 5 x 5 power. Skin: No rashes, lesions or ulcers Psychiatry: Judgement and insight appear normal. Mood & affect appropriate.     Data Reviewed: I have personally reviewed following labs and imaging studies  CBC: Recent Labs  Lab 01/19/19 0836 01/19/19 1617 01/20/19 0611  WBC 13.4* 10.3 7.6  HGB 12.1 11.1* 9.9*  HCT 38.2 35.1* 30.3*  MCV 95.7 96.4 97.1  PLT 273 282 241   Basic Metabolic Panel: Recent Labs  Lab 01/19/19 0836 01/19/19 1617 01/20/19 0611 01/20/19 1627 01/21/19 0913  NA 137  --  139 139 140  K 2.8* 3.0* 2.6* 3.3* 3.3*  CL 100  --  107 105 108  CO2 26  --  24 25 23   GLUCOSE 99  --  68* 128* 147*  BUN 22  --  12 9 7*  CREATININE 0.72 0.69 0.64 0.69 0.63  CALCIUM 8.9  --  8.1* 8.4* 8.3*  MG 1.8  --   --  2.3  --    GFR: Estimated Creatinine Clearance: 34.4 mL/min (by C-G formula based on SCr of 0.63 mg/dL). Liver Function Tests: Recent Labs  Lab 01/19/19 0836  AST 18  ALT 6  ALKPHOS 57  BILITOT 0.8  PROT 7.0  ALBUMIN 3.7   Recent Labs  Lab 01/19/19 0836  LIPASE 20   No results for input(s): AMMONIA in the last 168 hours. Coagulation Profile: No results for input(s): INR, PROTIME in the last  168 hours. Cardiac Enzymes: No results for input(s): CKTOTAL, CKMB, CKMBINDEX, TROPONINI in the last 168 hours. BNP (last 3 results) No results for input(s): PROBNP in the last 8760 hours. HbA1C: Recent Labs    01/19/19 1617  HGBA1C 6.3*   CBG: Recent Labs  Lab 01/20/19 1212 01/20/19 1708 01/20/19 2044 01/21/19 0744 01/21/19 1150  GLUCAP 94 136* 118* 77 178*   Lipid Profile: No results for input(s): CHOL, HDL, LDLCALC, TRIG, CHOLHDL, LDLDIRECT in the last 72 hours. Thyroid Function Tests: No results for input(s): TSH, T4TOTAL, FREET4, T3FREE, THYROIDAB in the last 72 hours. Anemia Panel: No results for input(s): VITAMINB12, FOLATE,  FERRITIN, TIBC, IRON, RETICCTPCT in the last 72 hours. Sepsis Labs: Recent Labs  Lab 01/19/19 0836  LATICACIDVEN 0.8    Recent Results (from the past 240 hour(s))  SARS Coronavirus 2 (CEPHEID - Performed in [facility] hospital lab), Hosp Order     Status: None   Collection Time: 01/19/19 10:24 AM  Result Value Ref Range Status   SARS Coronavirus 2 NEGATIVE NEGATIVE Final    Comment: (NOTE) If result is NEGATIVE SARS-CoV-2 target nucleic acids are NOT DETECTED. The SARS-CoV-2 RNA is generally detectable in upper and lower  respiratory specimens during the acute phase of infection. The lowest  concentration of SARS-CoV-2 viral copies this assay can detect is 250  copies / mL. A negative result does not preclude SARS-CoV-2 infection  and should not be used as the sole basis for treatment or other  patient management decisions.  A negative result may occur with  improper specimen collection / handling, submission of specimen other  than nasopharyngeal swab, presence of viral mutation(s) within the  areas targeted by this assay, and inadequate number of viral copies  (<250 copies / mL). A negative result must be combined with clinical  observations, patient history, and epidemiological information. If result is POSITIVE SARS-CoV-2 target nucleic acids are DETECTED. The SARS-CoV-2 RNA is generally detectable in upper and lower  respiratory specimens dur ing the acute phase of infection.  Positive  results are indicative of active infection with SARS-CoV-2.  Clinical  correlation with patient history and other diagnostic information is  necessary to determine patient infection status.  Positive results do  not rule out bacterial infection or co-infection with other viruses. If result is PRESUMPTIVE POSTIVE SARS-CoV-2 nucleic acids MAY BE PRESENT.   A presumptive positive result was obtained on the submitted specimen  and confirmed on repeat testing.  While 2019 novel coronavirus   (SARS-CoV-2) nucleic acids may be present in the submitted sample  additional confirmatory testing may be necessary for epidemiological  and / or clinical management purposes  to differentiate between  SARS-CoV-2 and other Sarbecovirus currently known to infect humans.  If clinically indicated additional testing with an alternate test  methodology [phone]) is advised. The SARS-CoV-2 RNA is generally  detectable in upper and lower respiratory sp ecimens during the acute  phase of infection. The expected result is Negative. Fact Sheet for Patients:  StrictlyIdeas.no Fact Sheet for Healthcare Providers: BankingDealers.co.za This test is not yet approved or cleared by the [place] FDA and has been authorized for detection and/or diagnosis of SARS-CoV-2 by FDA under an Emergency Use Authorization (EUA).  This EUA will remain in effect (meaning this test can be used) for the duration of the COVID-19 declaration under Section 564(b)(1) of the Act, 21 U.S.C. section 360bbb-3(b)(1), unless the authorization is terminated or revoked sooner. Performed at [facility], [address] [place], [place] [address]       Radiology Studies: No results found.      Scheduled Meds: . carbidopa-levodopa  2 tablet Oral TID  . gabapentin  300 mg Oral QHS  . heparin  5,000 Units Subcutaneous Q8H  . insulin aspart  0-5 Units Subcutaneous QHS  . insulin aspart  0-9 Units Subcutaneous TID WC  . potassium chloride  20 mEq Oral Daily   Continuous Infusions: . sodium chloride 75 mL/hr at 01/21/19 1209  . cefTRIAXone (ROCEPHIN)  IV 2 g (01/21/19 1210)    Assessment & Plan:    1.  Acute colitis: Diarrhea resolved. Tolerating soft diet will upgrade to regular diet.  Change IV Rocephin, Flagyl to oral antibiotics.  Discontinue IV fluids as patient tolerating liquid diet well.  Stool C. difficile testing not sent as patient having  semi-formed/formed stools at this time.  Leukocytosis resolved.  Afebrile  2.  Hypokalemia: Secondary to diarrhea and dilutional from IV fluids.  Replace potassium and magnesium as needed.  3.  Prolonged QTC: Avoid fluoroquinolones and replace potassium/magnesium as discussed above  4.  Parkinsonism: Resume Sinemet.  5.  Weakness/dizziness: She may have orthostasis secondary to problem #1 and in the setting of problem #4.  Check orthostatics (nurse could not obtain as patient unable to stand independently-requested physical therapy to check)  6.  Diabetes mellitus: On sliding scale insulin.  Hypoglycemic yesterday.  Resume home regimen if and when diet intake/blood glucose levels consistent.   7. HTN: Hold antihypertensives for now , check orthostatics. On b-blockers and diuretics at home.  DVT prophylaxis: Heparin Code Status: Full code Family / Patient Communication: Discussed with patient Disposition Plan: Seen by physical therapy who recommended skilled nursing facility placement.  Patient to discuss with family as she prefers to go home which case she would need home health PT set up.    LOS: 2 days    Time spent: 25 minutes    Alessandra BevelsNeelima Shahla Betsill, MD [facility] Pager [phone]  If 7PM-7AM, please contact night-coverage [email or web address] Password [email or web address] 01/21/2019, 4:17 PM

## 2019-01-22 LAB — GLUCOSE, CAPILLARY
Glucose-Capillary: 102 mg/dL — ABNORMAL HIGH (ref 70–99)
Glucose-Capillary: 140 mg/dL — ABNORMAL HIGH (ref 70–99)
Glucose-Capillary: 151 mg/dL — ABNORMAL HIGH (ref 70–99)
Glucose-Capillary: 156 mg/dL — ABNORMAL HIGH (ref 70–99)

## 2019-01-22 LAB — CBC
HCT: 35.2 % — ABNORMAL LOW (ref 36.0–46.0)
Hemoglobin: 11 g/dL — ABNORMAL LOW (ref 12.0–15.0)
MCH: 30.2 pg (ref 26.0–34.0)
MCHC: 31.3 g/dL (ref 30.0–36.0)
MCV: 96.7 fL (ref 80.0–100.0)
Platelets: 255 10*3/uL (ref 150–400)
RBC: 3.64 MIL/uL — ABNORMAL LOW (ref 3.87–5.11)
RDW: 12.7 % (ref 11.5–15.5)
WBC: 8.8 10*3/uL (ref 4.0–10.5)
nRBC: 0 % (ref 0.0–0.2)

## 2019-01-22 LAB — BASIC METABOLIC PANEL
Anion gap: 6 (ref 5–15)
BUN: 11 mg/dL (ref 8–23)
CO2: 26 mmol/L (ref 22–32)
Calcium: 8.4 mg/dL — ABNORMAL LOW (ref 8.9–10.3)
Chloride: 106 mmol/L (ref 98–111)
Creatinine, Ser: 0.67 mg/dL (ref 0.44–1.00)
GFR calc Af Amer: >60 mL/min (ref >60)
GFR calc non Af Amer: >60 mL/min (ref >60)
Glucose, Bld: 117 mg/dL — ABNORMAL HIGH (ref 70–99)
Potassium: 3.5 mmol/L (ref 3.5–5.1)
Sodium: 138 mmol/L (ref 135–145)

## 2019-01-22 MED ORDER — LEVOTHYROXINE SODIUM 25 MCG PO TABS
25.0000 ug | ORAL_TABLET | Freq: Every day | ORAL | Status: DC
  Administered 2019-01-23 – 2019-01-24 (×2): 25 ug via ORAL
  Filled 2019-01-22 (×3): qty 1

## 2019-01-22 MED ORDER — POTASSIUM CHLORIDE CRYS ER 20 MEQ PO TBCR
40.0000 meq | EXTENDED_RELEASE_TABLET | Freq: Once | ORAL | Status: AC
  Administered 2019-01-22: 40 meq via ORAL
  Filled 2019-01-22: qty 2

## 2019-01-22 MED ORDER — CARVEDILOL 3.125 MG PO TABS
3.1250 mg | ORAL_TABLET | Freq: Every day | ORAL | Status: DC
  Administered 2019-01-22 – 2019-01-24 (×3): 3.125 mg via ORAL
  Filled 2019-01-22 (×3): qty 1

## 2019-01-22 MED ORDER — HYDROCHLOROTHIAZIDE 25 MG PO TABS
25.0000 mg | ORAL_TABLET | Freq: Every day | ORAL | Status: DC
  Administered 2019-01-22 – 2019-01-24 (×3): 25 mg via ORAL
  Filled 2019-01-22 (×3): qty 1

## 2019-01-22 MED ORDER — POLYETHYLENE GLYCOL 3350 17 G PO PACK
17.0000 g | PACK | Freq: Every day | ORAL | Status: DC
  Administered 2019-01-22 – 2019-01-24 (×3): 17 g via ORAL
  Filled 2019-01-22 (×3): qty 1

## 2019-01-22 NOTE — Evaluation (Signed)
Occupational Therapy Evaluation Patient Details Name: Ashlee Reid MRN: [id number] DOB: May 14, 1926 Today's Date: 01/22/2019    History of Present Illness 85 yo female admitted with colitis, hypokalemia. Hx of Parkinson's, RA, DM, CVA   Clinical Impression   Pt admitted with colitis. Pt currently with functional limitations due to the deficits listed below (see OT Problem List).  Pt will benefit from skilled OT to increase their safety and independence with ADL and functional mobility for ADL to facilitate discharge to venue listed below.      Follow Up Recommendations  SNF    Equipment Recommendations  None recommended by OT    Recommendations for Other Services       Precautions / Restrictions Precautions Precautions: Fall      Mobility Bed Mobility Overal bed mobility: Needs Assistance Bed Mobility: Supine to Sit;Sit to Supine     Supine to sit: HOB elevated;Max assist Sit to supine: HOB elevated;Total assist   General bed mobility comments: pt able to sit EOB for grooming ttask for 10 min before returning to supine  Transfers                      Balance Overall balance assessment: History of Falls;Needs assistance Sitting-balance support: Feet supported Sitting balance-Leahy Scale: Poor                                     ADL either performed or assessed with clinical judgement   ADL Overall ADL's : Needs assistance/impaired Eating/Feeding: Minimal assistance;Sitting   Grooming: Minimal assistance;Sitting   Upper Body Bathing: Moderate assistance;Sitting   Lower Body Bathing: Bed level;Total assistance   Upper Body Dressing : Moderate assistance;Sitting   Lower Body Dressing: Sitting/lateral leans;Total assistance                 General ADL Comments: pt recognizes need for rehab as her daugher not able to care for her at this level     Vision Patient Visual Report: No change from baseline       Perception      Praxis      Pertinent Vitals/Pain Pain Score: 4  Pain Location: L shoulder Pain Descriptors / Indicators: Discomfort;Sore Pain Intervention(s): Limited activity within patient's tolerance;Monitored during session;Repositioned     Hand Dominance     Extremity/Trunk Assessment Upper Extremity Assessment Upper Extremity Assessment: LUE deficits/detail LUE Deficits / Details: unable to lift LUE against gravity due to arthritis per pt           Communication     Cognition Arousal/Alertness: Awake/alert Behavior During Therapy: WFL for tasks assessed/performed Overall Cognitive Status: Within Functional Limits for tasks assessed                                                Home Living Family/patient expects to be discharged to:: Private residence Living Arrangements: Children Available Help at Discharge: Family(cna: 8-12; 6-10  5 days/week) Type of Home: House Home Access: Stairs to enter [employer] of Steps: 2   Home Layout: One level               Home Equipment: Walker - 4 wheels          Prior Functioning/Environment Level of Independence: Needs assistance  Gait /  Transfers Assistance Needed: uses rollator for short household distances ADL's / Homemaking Assistance Needed: assist with bathing, dressing            OT Problem List: Decreased strength;Decreased activity tolerance;Impaired balance (sitting and/or standing);Obesity;Pain;Decreased knowledge of use of DME or AE;Impaired UE functional use      OT Treatment/Interventions: Self-care/ADL training;DME and/or AE instruction;Therapeutic activities;Patient/family education    OT Goals(Current goals can be found in the care plan section) Acute Rehab OT Goals Patient Stated Goal: to rehab OT Goal Formulation: With patient Time For Goal Achievement: 02/05/19  OT Frequency: Min 2X/week   Barriers to D/C:               AM-PAC OT "6 Clicks" Daily Activity      Outcome Measure Help from another person eating meals?: A Little   Help from another person toileting, which includes using toliet, bedpan, or urinal?: Total Help from another person bathing (including washing, rinsing, drying)?: A Lot Help from another person to put on and taking off regular upper body clothing?: A Lot Help from another person to put on and taking off regular lower body clothing?: Total 6 Click Score: 9   End of Session Nurse Communication: Mobility status  Activity Tolerance: Patient limited by lethargy Patient left: in bed;with call bell/phone within reach  OT Visit Diagnosis: Unsteadiness on feet (R26.81);Muscle weakness (generalized) (M62.81);History of falling (Z91.81);Repeated falls (R29.6)                Time: 2536-6440 OT Time Calculation (min): 24 min Charges:  OT General Charges $OT Visit: 1 Visit OT Evaluation $OT Eval Moderate Complexity: 1 Mod  Lise Auer, OT Acute Rehabilitation Services [phone] Office- [phone]     Shailynn Fong, Karin Golden D 01/22/2019, 1:02 PM

## 2019-01-22 NOTE — Progress Notes (Signed)
PROGRESS NOTE    Ashlee Reid  ZCH:[id number] DOB: [birth year] DOA: 01/19/2019 PCP: Henrine Screws, MD   Brief Narrative: Patient is a 85 year old female from home with diabetes mellitus, Parkinson's disease, rheumatoid arthritis, stroke, depression who presents to the emerge department with weakness, abdominal pain, poor appetite, multiple episodes of diarrhea without hematochezia or melena.  CT abdomen obtained in the emergency department showed cecal, descending/sigmoid/ascending colon wall thickening suggesting colitis.  C. difficile was ordered but patient's diarrhea resolved before sample could be obtained.  Currently she is denies any abdominal pain.  She was seen by physical therapy and recommended skilled nursing facility on discharge.  Assessment & Plan:   Principal Problem:   Colitis Active Problems:   Parkinsonism (HCC)   Diarrhea   Hypokalemia   Prolonged QT interval   Acute colitis:CT as above. Diarrhea has resolved.  Tolerating soft diet.  Now upgraded to regular diet.  Antibiotics changed to oral.  Denies any abdominal pain.  Hypokalemia: Being supplemented with potassium  Prolonged QTC:Qtc of 641 ms. Will check EKG today    Parkinsonism: Resume Sinemet.  Continue supportive care  Diabetes mellitus: On sliding scale insulin.  Resume home regimen on discharge.  Hypertension: Antihypertensives on hold.  Hypertensive today.  Will resume her home meds.  Weakness/dizziness: Physical therapy evaluated the patient and recommended skilled nursing facility at  Discharge.  Social worker consulted         DVT prophylaxis: [place] heparin Code Status: Full Family Communication: Discussed with daughter on phone Disposition Plan: Skilled nursing facility as soon as bed is available   Consultants: None  Procedures: None  Antimicrobials:  Anti-infectives (From admission, onward)   Start     Dose/Rate Route Frequency Ordered Stop   01/22/19 1000  cefdinir (OMNICEF)  capsule 300 mg     300 mg Oral Every 12 hours 01/21/19 1645     01/21/19 2200  cefdinir (OMNICEF) capsule 300 mg  Status:  Discontinued     300 mg Oral Every 12 hours 01/21/19 1634 01/21/19 1645   01/21/19 1645  metroNIDAZOLE (FLAGYL) tablet 500 mg     500 mg Oral Every 8 hours 01/21/19 1635     01/19/19 1500  cefTRIAXone (ROCEPHIN) 2 g in sodium chloride 0.9 % 100 mL IVPB  Status:  Discontinued     2 g 200 mL/hr over 30 Minutes Intravenous Every 24 hours 01/19/19 1337 01/21/19 1633   01/19/19 1100  ciprofloxacin (CIPRO) IVPB 400 mg     400 mg 200 mL/hr over 60 Minutes Intravenous  Once 01/19/19 1046 01/19/19 1202   01/19/19 1100  metroNIDAZOLE (FLAGYL) IVPB 500 mg     500 mg 100 mL/hr over 60 Minutes Intravenous  Once 01/19/19 1046 01/19/19 1304      Subjective: Patient seen and examined the bedside this morning.  Looks comfortable.  Hemodynamically stable.  Denies any dizziness, abdominal pain, lightheadedness.  Objective: Vitals:   01/21/19 1426 01/21/19 2048 01/22/19 0452 01/22/19 1250  BP: (!) 120/58 (!) 128/46 124/60 (!) 174/65  Pulse: 66 (!) 58 (!) 55 65  Resp: 16 19 20 14   Temp: 98 F (36.7 C) 98.1 F (36.7 C) 97.7 F (36.5 C) 98.3 F (36.8 C)  TempSrc: Oral Oral Oral Oral  SpO2: 97% 99% 96% 95%  Weight:      Height:        Intake/Output Summary (Last 24 hours) at 01/22/2019 1314 Last data filed at 01/22/2019 0917 Gross per 24 hour  Intake 600 ml  Output 1000 ml  Net -400 ml   Filed Weights   01/19/19 9563  Weight: 47.6 kg    Examination:  General exam: Appears calm and comfortable ,Not in distress, pleasant elderly female  HEENT:PERRL,Oral mucosa moist, Ear/Nose normal on gross exam Respiratory system: Bilateral equal air entry, normal vesicular breath sounds, no wheezes or crackles  Cardiovascular system: S1 & S2 heard, RRR. No JVD, murmurs, rubs, gallops or clicks. No pedal edema. Gastrointestinal system: Abdomen is nondistended, soft .  Mild left lower  quadrant tenderness.. No organomegaly or masses felt. Normal bowel sounds heard. Central nervous system: Alert and oriented. No focal neurological deficits. Extremities: No edema, no clubbing ,no cyanosis, distal peripheral pulses palpable. Skin: No rashes, lesions or ulcers,no icterus ,no pallor MSK: Normal muscle bulk,tone ,power Psychiatry: Judgement and insight appear normal. Mood & affect appropriate.     Data Reviewed: I have personally reviewed following labs and imaging studies  CBC: Recent Labs  Lab 01/19/19 0836 01/19/19 1617 01/20/19 0611 01/22/19 0535  WBC 13.4* 10.3 7.6 8.8  HGB 12.1 11.1* 9.9* 11.0*  HCT 38.2 35.1* 30.3* 35.2*  MCV 95.7 96.4 97.1 96.7  PLT 273 282 241 875   Basic Metabolic Panel: Recent Labs  Lab 01/19/19 0836 01/19/19 1617 01/20/19 0611 01/20/19 1627 01/21/19 0913 01/22/19 0535  NA 137  --  139 139 140 138  K 2.8* 3.0* 2.6* 3.3* 3.3* 3.5  CL 100  --  107 105 108 106  CO2 26  --  24 25 23 26   GLUCOSE 99  --  68* 128* 147* 117*  BUN 22  --  12 9 7* 11  CREATININE 0.72 0.69 0.64 0.69 0.63 0.67  CALCIUM 8.9  --  8.1* 8.4* 8.3* 8.4*  MG 1.8  --   --  2.3  --   --    GFR: Estimated Creatinine Clearance: 34.4 mL/min (by C-G formula based on SCr of 0.67 mg/dL). Liver Function Tests: Recent Labs  Lab 01/19/19 0836  AST 18  ALT 6  ALKPHOS 57  BILITOT 0.8  PROT 7.0  ALBUMIN 3.7   Recent Labs  Lab 01/19/19 0836  LIPASE 20   No results for input(s): AMMONIA in the last 168 hours. Coagulation Profile: No results for input(s): INR, PROTIME in the last 168 hours. Cardiac Enzymes: No results for input(s): CKTOTAL, CKMB, CKMBINDEX, TROPONINI in the last 168 hours. BNP (last 3 results) No results for input(s): PROBNP in the last 8760 hours. HbA1C: Recent Labs    01/19/19 1617  HGBA1C 6.3*   CBG: Recent Labs  Lab 01/21/19 1150 01/21/19 1659 01/21/19 2115 01/22/19 0744 01/22/19 1143  GLUCAP 178* 107* 137* 102* 140*   Lipid  Profile: No results for input(s): CHOL, HDL, LDLCALC, TRIG, CHOLHDL, LDLDIRECT in the last 72 hours. Thyroid Function Tests: No results for input(s): TSH, T4TOTAL, FREET4, T3FREE, THYROIDAB in the last 72 hours. Anemia Panel: No results for input(s): VITAMINB12, FOLATE, FERRITIN, TIBC, IRON, RETICCTPCT in the last 72 hours. Sepsis Labs: Recent Labs  Lab 01/19/19 0836  LATICACIDVEN 0.8    Recent Results (from the past 240 hour(s))  SARS Coronavirus 2 (CEPHEID - Performed in [facility] hospital lab), Hosp Order     Status: None   Collection Time: 01/19/19 10:24 AM  Result Value Ref Range Status   SARS Coronavirus 2 NEGATIVE NEGATIVE Final    Comment: (NOTE) If result is NEGATIVE SARS-CoV-2 target nucleic acids are NOT DETECTED. The SARS-CoV-2 RNA is generally detectable in  upper and lower  respiratory specimens during the acute phase of infection. The lowest  concentration of SARS-CoV-2 viral copies this assay can detect is 250  copies / mL. A negative result does not preclude SARS-CoV-2 infection  and should not be used as the sole basis for treatment or other  patient management decisions.  A negative result may occur with  improper specimen collection / handling, submission of specimen other  than nasopharyngeal swab, presence of viral mutation(s) within the  areas targeted by this assay, and inadequate number of viral copies  (<250 copies / mL). A negative result must be combined with clinical  observations, patient history, and epidemiological information. If result is POSITIVE SARS-CoV-2 target nucleic acids are DETECTED. The SARS-CoV-2 RNA is generally detectable in upper and lower  respiratory specimens dur ing the acute phase of infection.  Positive  results are indicative of active infection with SARS-CoV-2.  Clinical  correlation with patient history and other diagnostic information is  necessary to determine patient infection status.  Positive results do  not rule  out bacterial infection or co-infection with other viruses. If result is PRESUMPTIVE POSTIVE SARS-CoV-2 nucleic acids MAY BE PRESENT.   A presumptive positive result was obtained on the submitted specimen  and confirmed on repeat testing.  While 2019 novel coronavirus  (SARS-CoV-2) nucleic acids may be present in the submitted sample  additional confirmatory testing may be necessary for epidemiological  and / or clinical management purposes  to differentiate between  SARS-CoV-2 and other Sarbecovirus currently known to infect humans.  If clinically indicated additional testing with an alternate test  methodology [phone]([id number] is advised. The SARS-CoV-2 RNA is generally  detectable in upper and lower respiratory sp ecimens during the acute  phase of infection. The expected result is Negative. Fact Sheet for Patients:  BoilerBrush.com.cyhttps://www.fda.gov/media/136312/download Fact Sheet for Healthcare Providers: https://pope.com/https://www.fda.gov/media/136313/download This test is not yet approved or cleared by the [place] FDA and has been authorized for detection and/or diagnosis of SARS-CoV-2 by FDA under an Emergency Use Authorization (EUA).  This EUA will remain in effect (meaning this test can be used) for the duration of the COVID-19 declaration under Section 564(b)(1) of the Act, 21 U.S.C. section 360bbb-3(b)(1), unless the authorization is terminated or revoked sooner. Performed at [facility], [address] [place], [place] [address]          Radiology Studies: No results found.      Scheduled Meds: . carbidopa-levodopa  2 tablet Oral TID  . cefdinir  300 mg Oral Q12H  . gabapentin  300 mg Oral QHS  . heparin  5,000 Units Subcutaneous Q8H  . insulin aspart  0-5 Units Subcutaneous QHS  . insulin aspart  0-9 Units Subcutaneous TID WC  . metroNIDAZOLE  500 mg Oral Q8H  . polyethylene glycol  17 g Oral Daily  . potassium chloride  40 mEq Oral Once   Continuous  Infusions:   LOS: 3 days    Time spent: 35 mins.More than 50% of that time was spent in counseling and/or coordination of care.      Burnadette PopAmrit Escher Harr, MD [facility] Pager [phone]  If 7PM-7AM, please contact night-coverage [email or web address] Password [email or web address] 01/22/2019, 1:14 PM

## 2019-01-22 NOTE — [place] FL2 (Signed)
[place] MEDICAID FL2 LEVEL OF CARE SCREENING TOOL     IDENTIFICATION  Patient Name: Ashlee Reid Birthdate: March 24, 1926 Sex: female Admission Date (Current Location): 01/19/2019  [place] and [place] Number:  Producer, television/film/video and Address:  [facility],  [address] [place] [address]      Provider Number: [id number]  Attending Physician Name and Address:  Burnadette Pop, MD  Relative Name and Phone Number:       Current Level of Care: Hospital Recommended Level of Care: Skilled Nursing Facility Prior Approval Number:    Date Approved/Denied:   PASRR Number: pending  Discharge Plan: SNF    Current Diagnoses: Patient Active Problem List   Diagnosis Date Noted  . Colitis 01/19/2019  . Diarrhea 01/19/2019  . Hypokalemia 01/19/2019  . Prolonged QT interval 01/19/2019  . Slurred speech 04/13/2017  . Parkinsonism (HCC) 11/17/2015  . Abnormality of gait 11/17/2015    Orientation RESPIRATION BLADDER Height & Weight     Self, Time, Situation, Place  Normal Incontinent Weight: 105 lb (47.6 kg) Height:  5\' 3"  (160 cm)  BEHAVIORAL SYMPTOMS/MOOD NEUROLOGICAL BOWEL NUTRITION STATUS      Continent Diet(carb modified)  AMBULATORY STATUS COMMUNICATION OF NEEDS Skin   Extensive Assist Verbally Normal                       Personal Care Assistance Level of Assistance  Bathing, Feeding, Dressing Bathing Assistance: Maximum assistance Feeding assistance: Independent Dressing Assistance: Maximum assistance     Functional Limitations Info  Sight, Hearing, Speech Sight Info: Adequate Hearing Info: Adequate Speech Info: Adequate    SPECIAL CARE FACTORS FREQUENCY  PT (By licensed PT), OT (By licensed OT)     PT Frequency: 5x OT Frequency: 5x            Contractures Contractures Info: Not present    Additional Factors Info  Code Status, Allergies Code Status Info: full code Allergies Info: Codeine, Sulfa Antibiotics            Current Medications (01/22/2019):  This is the current hospital active medication list Current Facility-Administered Medications  Medication Dose Route Frequency Provider Last Rate Last Dose  . acetaminophen (TYLENOL) tablet 650 mg  650 mg Oral Q6H PRN Rai, Ripudeep K, MD       Or  . acetaminophen (TYLENOL) suppository 650 mg  650 mg Rectal Q6H PRN Rai, Ripudeep K, MD      . carbidopa-levodopa (SINEMET IR) 25-100 MG per tablet immediate release 2 tablet  2 tablet Oral TID Rai, Ripudeep K, MD   2 tablet at 01/22/19 1047  . cefdinir (OMNICEF) capsule 300 mg  300 mg Oral Q12H Wofford, Drew A, RPH   300 mg at 01/22/19 1047  . gabapentin (NEURONTIN) capsule 300 mg  300 mg Oral QHS Rai, Ripudeep K, MD   300 mg at 01/21/19 2129  . heparin injection 5,000 Units  5,000 Units Subcutaneous Q8H Rai, Ripudeep K, MD   5,000 Units at 01/21/19 2129  . HYDROcodone-acetaminophen (NORCO/VICODIN) 5-325 MG per tablet 1 tablet  1 tablet Oral Q6H PRN Rai, Ripudeep K, MD   1 tablet at 01/20/19 1859  . insulin aspart (novoLOG) injection 0-5 Units  0-5 Units Subcutaneous QHS Rai, Ripudeep K, MD      . insulin aspart (novoLOG) injection 0-9 Units  0-9 Units Subcutaneous TID WC Rai, Ripudeep K, MD   2 Units at 01/21/19 1153  . metroNIDAZOLE (FLAGYL) tablet 500 mg  500 mg Oral Q8H [place] Shi, MD   500 mg at 01/22/19 0539  . polyethylene glycol (MIRALAX / GLYCOLAX) packet 17 g  17 g Oral Daily Shelly Coss, MD   17 g at 01/22/19 1048     Discharge Medications: Please see discharge summary for a list of discharge medications.  Relevant Imaging Results:  Relevant Lab Results:   Additional Information SS# [id number]  Nila Nephew, LCSW

## 2019-01-22 NOTE — Care Management Important Message (Signed)
Important Message  Patient Details IM Letter given to Sharren Bridge SW to present to the Patient Name: Ashlee Reid MRN: [id number] Date of Birth: [birth year]   Medicare Important Message Given:  Yes    Kerin Salen 01/22/2019, 9:52 AM

## 2019-01-22 NOTE — TOC Initial Note (Signed)
Transition of Care [facility]) - Initial/Assessment Note    Patient Details  Name: Ashlee Reid MRN: [id number] Date of Birth: 27-Jul-1926  [facility]) CM/SW Contact:    Nila Nephew, LCSW Phone Number: [phone] coverage for [phone] 01/22/2019, 12:05 PM  Clinical Narrative:    Pt Admitted with colitis from home where she resides with her daughter and daughter's family.  Uses RW to ambulate at home but is deconditioned and not at baseline. Has agreed to pursue SNF for rehab at DC.  CSW completed FL2 and referrals- pt requests bed offers be provided to daughter to make selection. Pasrr in manual review. Will follow up on requested documents.               Expected Discharge Plan: Skilled Nursing Facility Barriers to Discharge: Continued Medical Work up, Ship broker   Patient Goals and CMS Choice Patient states their goals for this hospitalization and ongoing recovery are:: get back home CMS Medicare.gov Compare Post Acute Care list provided to:: Other (Comment Required)(patient requests it be provided to daughter) Choice offered to / list presented to : (will present to daughter)  Expected Discharge Plan and Services Expected Discharge Plan: [place] Choice: [place] arrangements for the past 2 months: Single Family Home                                      Prior Living Arrangements/Services Living arrangements for the past 2 months: Single Family Home Lives with:: Adult Children Patient language and need for interpreter reviewed:: No Do you feel safe going back to the place where you live?: Yes      Need for Family Participation in Patient Care: Yes (Comment)(daughter) Care giver support system in place?: Yes (comment)(daughter)   Criminal Activity/Legal Involvement Pertinent to Current Situation/Hospitalization: No - Comment as needed  Activities of Daily Living Home  Assistive Devices/Equipment: Walker (specify type) ADL Screening (condition at time of admission) Patient's cognitive ability adequate to safely complete daily activities?: Yes Is the patient deaf or have difficulty hearing?: No Does the patient have difficulty seeing, even when wearing glasses/contacts?: No Does the patient have difficulty concentrating, remembering, or making decisions?: Yes Patient able to express need for assistance with ADLs?: Yes Does the patient have difficulty dressing or bathing?: Yes Independently performs ADLs?: No Communication: Independent Dressing (OT): Dependent Is this a change from baseline?: Pre-admission baseline Grooming: Dependent Is this a change from baseline?: Pre-admission baseline Feeding: Needs assistance Is this a change from baseline?: Pre-admission baseline Bathing: Dependent Is this a change from baseline?: Pre-admission baseline Toileting: Dependent Is this a change from baseline?: Pre-admission baseline In/Out Bed: Dependent Is this a change from baseline?: Pre-admission baseline Walks in Home: Dependent Is this a change from baseline?: Pre-admission baseline Does the patient have difficulty walking or climbing stairs?: No Weakness of Legs: Both Weakness of Arms/Hands: Both  Permission Sought/Granted Permission sought to share information with : Family Supports Permission granted to share information with : Yes, Verbal Permission Granted  Share Information with NAME: [phone] daughter [name]           Emotional Assessment Appearance:: Appears stated age Attitude/Demeanor/Rapport: Engaged(drowsy) Affect (typically observed): Accepting, Pleasant Orientation: : Oriented to Self, Oriented to Place, Oriented to  Time, Oriented to Situation Alcohol / Substance Use: Not Applicable Psych Involvement: No (comment)  Admission diagnosis:  Hypokalemia [E87.6] Colitis [K52.9] Diarrhea, unspecified type [R19.7] Patient Active  Problem List   Diagnosis Date Noted  . Colitis 01/19/2019  . Diarrhea 01/19/2019  . Hypokalemia 01/19/2019  . Prolonged QT interval 01/19/2019  . Slurred speech 04/13/2017  . Parkinsonism (HCC) 11/17/2015  . Abnormality of gait 11/17/2015   PCP:  Henrine Screws, MD Pharmacy:   [facility] [address] [place] - [address] [place] [address] Phone: [phone] Fax: [phone]  [facility] [phone] [place], [place] - [address] [place] [address] Phone: [phone] Fax: [phone]     Social Determinants of Health (SDOH) Interventions    Readmission Risk Interventions No flowsheet data found.

## 2019-01-23 LAB — GLUCOSE, CAPILLARY
Glucose-Capillary: 92 mg/dL (ref 70–99)
Glucose-Capillary: 193 mg/dL — ABNORMAL HIGH (ref 70–99)
Glucose-Capillary: 186 mg/dL — ABNORMAL HIGH (ref 70–99)
Glucose-Capillary: 156 mg/dL — ABNORMAL HIGH (ref 70–99)

## 2019-01-23 MED ORDER — BISACODYL 10 MG RE SUPP
10.0000 mg | Freq: Once | RECTAL | Status: AC
  Administered 2019-01-23: 10 mg via RECTAL
  Filled 2019-01-23: qty 1

## 2019-01-23 NOTE — Progress Notes (Signed)
PROGRESS NOTE    Ashlee Reid  VQM:[id number] DOB: [birth year] DOA: 01/19/2019 PCP: Henrine Screws, MD   Brief Narrative: Patient is a 85 year old female from home with diabetes mellitus, Parkinson's disease, rheumatoid arthritis, stroke, depression who presents to the emerge department with weakness, abdominal pain, poor appetite, multiple episodes of diarrhea without hematochezia or melena.  CT abdomen obtained in the emergency department showed cecal, descending/sigmoid/ascending colon wall thickening suggesting colitis.  C. difficile was ordered but patient's diarrhea resolved before sample could be obtained.  Currently she is denies any abdominal pain.  She was seen by physical therapy and recommended skilled nursing facility on discharge.  Assessment & Plan:   Principal Problem:   Colitis Active Problems:   Parkinsonism (HCC)   Diarrhea   Hypokalemia   Prolonged QT interval   Acute colitis:CT as above. Diarrhea has resolved.  Tolerating soft diet.  Now upgraded to regular diet.  Antibiotics changed to oral. Continue for total of 10-14 days . Denies any abdominal pain.  Hypokalemia: Supplemented with potassium  Prolonged QTC:Qtc of 641 ms. Improved to 472 ms as per EKG on 01/22/19.    Parkinsonism: Resume Sinemet.  Continue supportive care  Diabetes mellitus: On sliding scale insulin.  Resume home regimen on discharge.  Hypertension: Antihypertensives on hold.  Hypertensive today.  Resumed her home meds.  Weakness/dizziness: Physical therapy evaluated the patient and recommended skilled nursing facility at  Discharge.  Social worker consulted and working on the discharge.  Constipation: On MiraLAX.Will order a dose of dulcolax.         DVT prophylaxis: [place] heparin Code Status: Full Family Communication: Discussed with daughter on phone on 01/22/19 Disposition Plan: Skilled nursing facility as soon as bed is available   Consultants: None  Procedures: None   Antimicrobials:  Anti-infectives (From admission, onward)   Start     Dose/Rate Route Frequency Ordered Stop   01/22/19 1000  cefdinir (OMNICEF) capsule 300 mg     300 mg Oral Every 12 hours 01/21/19 1645     01/21/19 2200  cefdinir (OMNICEF) capsule 300 mg  Status:  Discontinued     300 mg Oral Every 12 hours 01/21/19 1634 01/21/19 1645   01/21/19 1645  metroNIDAZOLE (FLAGYL) tablet 500 mg     500 mg Oral Every 8 hours 01/21/19 1635     01/19/19 1500  cefTRIAXone (ROCEPHIN) 2 g in sodium chloride 0.9 % 100 mL IVPB  Status:  Discontinued     2 g 200 mL/hr over 30 Minutes Intravenous Every 24 hours 01/19/19 1337 01/21/19 1633   01/19/19 1100  ciprofloxacin (CIPRO) IVPB 400 mg     400 mg 200 mL/hr over 60 Minutes Intravenous  Once 01/19/19 1046 01/19/19 1202   01/19/19 1100  metroNIDAZOLE (FLAGYL) IVPB 500 mg     500 mg 100 mL/hr over 60 Minutes Intravenous  Once 01/19/19 1046 01/19/19 1304      Subjective: Patient seen and examined the bedside this morning.  Remains comfortable.  Hemodynamically stable.  Abdomen pain has significantly improved.  Objective: Vitals:   01/22/19 0452 01/22/19 1250 01/22/19 2134 01/23/19 0622  BP: 124/60 (!) 174/65 (!) 164/63 (!) 169/62  Pulse: (!) 55 65 (!) 52 (!) 53  Resp: 20 14 19 20   Temp: 97.7 F (36.5 C) 98.3 F (36.8 C) 97.7 F (36.5 C)   TempSrc: Oral Oral Oral   SpO2: 96% 95% 100% 95%  Weight:      Height:  Intake/Output Summary (Last 24 hours) at 01/23/2019 1348 Last data filed at 01/23/2019 1130 Gross per 24 hour  Intake 240 ml  Output 1400 ml  Net -1160 ml   Filed Weights   01/19/19 5784  Weight: 47.6 kg    Examination:   General exam: Appears calm and comfortable ,Not in distress, elderly pleasant female  HEENT:PERRL,Oral mucosa moist, Ear/Nose normal on gross exam Respiratory system: Bilateral equal air entry, normal vesicular breath sounds, no wheezes or crackles  Cardiovascular system: S1 & S2 heard, RRR. No  JVD, murmurs, rubs, gallops or clicks. Gastrointestinal system: Abdomen is nondistended, soft .  Mild left lower quadrant tenderness. no organomegaly or masses felt. Normal bowel sounds heard. Central nervous system: Alert and oriented. No focal neurological deficits. Extremities: No edema, no clubbing ,no cyanosis, distal peripheral pulses palpable. Skin: No rashes, lesions or ulcers,no icterus ,no pallor   Data Reviewed: I have personally reviewed following labs and imaging studies  CBC: Recent Labs  Lab 01/19/19 0836 01/19/19 1617 01/20/19 0611 01/22/19 0535  WBC 13.4* 10.3 7.6 8.8  HGB 12.1 11.1* 9.9* 11.0*  HCT 38.2 35.1* 30.3* 35.2*  MCV 95.7 96.4 97.1 96.7  PLT 273 282 241 696   Basic Metabolic Panel: Recent Labs  Lab 01/19/19 0836 01/19/19 1617 01/20/19 0611 01/20/19 1627 01/21/19 0913 01/22/19 0535  NA 137  --  139 139 140 138  K 2.8* 3.0* 2.6* 3.3* 3.3* 3.5  CL 100  --  107 105 108 106  CO2 26  --  24 25 23 26   GLUCOSE 99  --  68* 128* 147* 117*  BUN 22  --  12 9 7* 11  CREATININE 0.72 0.69 0.64 0.69 0.63 0.67  CALCIUM 8.9  --  8.1* 8.4* 8.3* 8.4*  MG 1.8  --   --  2.3  --   --    GFR: Estimated Creatinine Clearance: 34.4 mL/min (by C-G formula based on SCr of 0.67 mg/dL). Liver Function Tests: Recent Labs  Lab 01/19/19 0836  AST 18  ALT 6  ALKPHOS 57  BILITOT 0.8  PROT 7.0  ALBUMIN 3.7   Recent Labs  Lab 01/19/19 0836  LIPASE 20   No results for input(s): AMMONIA in the last 168 hours. Coagulation Profile: No results for input(s): INR, PROTIME in the last 168 hours. Cardiac Enzymes: No results for input(s): CKTOTAL, CKMB, CKMBINDEX, TROPONINI in the last 168 hours. BNP (last 3 results) No results for input(s): PROBNP in the last 8760 hours. HbA1C: No results for input(s): HGBA1C in the last 72 hours. CBG: Recent Labs  Lab 01/22/19 1143 01/22/19 1619 01/22/19 2137 01/23/19 0745 01/23/19 1154  GLUCAP 140* 151* 156* 92 193*   Lipid  Profile: No results for input(s): CHOL, HDL, LDLCALC, TRIG, CHOLHDL, LDLDIRECT in the last 72 hours. Thyroid Function Tests: No results for input(s): TSH, T4TOTAL, FREET4, T3FREE, THYROIDAB in the last 72 hours. Anemia Panel: No results for input(s): VITAMINB12, FOLATE, FERRITIN, TIBC, IRON, RETICCTPCT in the last 72 hours. Sepsis Labs: Recent Labs  Lab 01/19/19 0836  LATICACIDVEN 0.8    Recent Results (from the past 240 hour(s))  SARS Coronavirus 2 (CEPHEID - Performed in [facility] hospital lab), Hosp Order     Status: None   Collection Time: 01/19/19 10:24 AM  Result Value Ref Range Status   SARS Coronavirus 2 NEGATIVE NEGATIVE Final    Comment: (NOTE) If result is NEGATIVE SARS-CoV-2 target nucleic acids are NOT DETECTED. The SARS-CoV-2 RNA is generally  detectable in upper and lower  respiratory specimens during the acute phase of infection. The lowest  concentration of SARS-CoV-2 viral copies this assay can detect is 250  copies / mL. A negative result does not preclude SARS-CoV-2 infection  and should not be used as the sole basis for treatment or other  patient management decisions.  A negative result may occur with  improper specimen collection / handling, submission of specimen other  than nasopharyngeal swab, presence of viral mutation(s) within the  areas targeted by this assay, and inadequate number of viral copies  (<250 copies / mL). A negative result must be combined with clinical  observations, patient history, and epidemiological information. If result is POSITIVE SARS-CoV-2 target nucleic acids are DETECTED. The SARS-CoV-2 RNA is generally detectable in upper and lower  respiratory specimens dur ing the acute phase of infection.  Positive  results are indicative of active infection with SARS-CoV-2.  Clinical  correlation with patient history and other diagnostic information is  necessary to determine patient infection status.  Positive results do  not rule  out bacterial infection or co-infection with other viruses. If result is PRESUMPTIVE POSTIVE SARS-CoV-2 nucleic acids MAY BE PRESENT.   A presumptive positive result was obtained on the submitted specimen  and confirmed on repeat testing.  While 2019 novel coronavirus  (SARS-CoV-2) nucleic acids may be present in the submitted sample  additional confirmatory testing may be necessary for epidemiological  and / or clinical management purposes  to differentiate between  SARS-CoV-2 and other Sarbecovirus currently known to infect humans.  If clinically indicated additional testing with an alternate test  methodology [phone]) is advised. The SARS-CoV-2 RNA is generally  detectable in upper and lower respiratory sp ecimens during the acute  phase of infection. The expected result is Negative. Fact Sheet for Patients:  BoilerBrush.com.cy Fact Sheet for Healthcare Providers: https://pope.com/ This test is not yet approved or cleared by the [place] FDA and has been authorized for detection and/or diagnosis of SARS-CoV-2 by FDA under an Emergency Use Authorization (EUA).  This EUA will remain in effect (meaning this test can be used) for the duration of the COVID-19 declaration under Section 564(b)(1) of the Act, 21 U.S.C. section 360bbb-3(b)(1), unless the authorization is terminated or revoked sooner. Performed at [facility], [address] [place], [place] [address]          Radiology Studies: No results found.      Scheduled Meds: . carbidopa-levodopa  2 tablet Oral TID  . carvedilol  3.125 mg Oral Daily  . cefdinir  300 mg Oral Q12H  . gabapentin  300 mg Oral QHS  . heparin  5,000 Units Subcutaneous Q8H  . hydrochlorothiazide  25 mg Oral Daily  . insulin aspart  0-5 Units Subcutaneous QHS  . insulin aspart  0-9 Units Subcutaneous TID WC  . levothyroxine  25 mcg Oral Q0600  . metroNIDAZOLE  500 mg  Oral Q8H  . polyethylene glycol  17 g Oral Daily   Continuous Infusions:   LOS: 4 days    Time spent: 35 mins.More than 50% of that time was spent in counseling and/or coordination of care.      Burnadette Pop, MD [facility] Pager [phone]  If 7PM-7AM, please contact night-coverage [email or web address] Password [email or web address] 01/23/2019, 1:48 PM

## 2019-01-23 NOTE — TOC Progression Note (Signed)
Transition of Care [facility]) - Progression Note    Patient Details  Name: Ashlee Reid MRN: [id number] Date of Birth: 28-Oct-1925  [facility]) CM/SW Contact  Servando Snare, LCSW Phone Number: 01/23/2019, 11:53 AM  Clinical Narrative:   Submitted additional docs for level II PASRR. LCSW started insurance auth. Pt and family need to make bed choice. Left message for daughter.     Expected Discharge Plan: [place] Barriers to Discharge: Continued Medical Work up, Ship broker  Expected Discharge Plan and Services Expected Discharge Plan: [place] Choice: [place] arrangements for the past 2 months: Single Family Home                                       Social Determinants of Health (SDOH) Interventions    Readmission Risk Interventions No flowsheet data found.

## 2019-01-24 LAB — GLUCOSE, CAPILLARY
Glucose-Capillary: 104 mg/dL — ABNORMAL HIGH (ref 70–99)
Glucose-Capillary: 163 mg/dL — ABNORMAL HIGH (ref 70–99)

## 2019-01-24 MED ORDER — METRONIDAZOLE 500 MG PO TABS: 500.0000 mg | ORAL_TABLET | Freq: Three times a day (TID) | ORAL | 0 refills | Status: AC

## 2019-01-24 MED ORDER — GABAPENTIN 300 MG PO CAPS: 300.0000 mg | ORAL_CAPSULE | Freq: Every day | ORAL | 0 refills | Status: AC

## 2019-01-24 MED ORDER — CEFDINIR 300 MG PO CAPS: 300.0000 mg | ORAL_CAPSULE | Freq: Two times a day (BID) | ORAL | 0 refills | Status: AC

## 2019-01-24 MED ORDER — HYDROCODONE-ACETAMINOPHEN 5-325 MG PO TABS: 1.0000 | ORAL_TABLET | Freq: Four times a day (QID) | ORAL | 0 refills | Status: AC | PRN

## 2019-01-24 NOTE — TOC Transition Note (Addendum)
Transition of Care [facility]) - CM/SW Discharge Note   Patient Details  Name: [name] MRN: [id number] Date of Birth: Dec 18, 1925  Transition of Care [facility]) CM/SW Contact:  Servando Snare, LCSW Phone Number: 01/24/2019, 11:38 AM   Clinical Narrative:   Patient to dc to [facility] place room 104P. LCSW faxed dc docs to facility and left message for daughter, [name]. Patient can transport after 3.   RN report number: [phone]    Final next level of care: [place] Barriers to Discharge: No Barriers Identified   Patient Goals and CMS Choice Patient states their goals for this hospitalization and ongoing recovery are:: get back home CMS Medicare.gov Compare Post Acute Care list provided to:: Patient Represenative (must comment) Choice offered to / list presented to : Adult Children  Discharge Placement PASRR number recieved: 01/24/19            Patient chooses bed at: [facility] Patient to be transferred to facility by: EMS Name of family member notified: Debbie Patient and family notified of of transfer: 01/24/19  Discharge Plan and Services     Post Acute Care Choice: [place]                               Social Determinants of Health (SDOH) Interventions     Readmission Risk Interventions No flowsheet data found.

## 2019-01-24 NOTE — Discharge Summary (Signed)
Physician Discharge Summary  Ashlee Reid BWL:[id number] DOB: Aug 19, 1925 DOA: 01/19/2019  PCP: Henrine Screws, MD  Admit date: 01/19/2019 Discharge date: 01/24/2019  Recommendations for Outpatient Follow-up:  Follow up with PCP in 7-10 days after dc from SNF.  Contact information for after-discharge care    Destination    [facility] .   Service: Skilled Nursing Contact information: [address] [place] [address] [phone]               Discharge Diagnoses: Principal diagnosis is #1 1. Colitis 2. Debility, Ambulatory dysfunction 3. Parkinsons 4. Hypokalemia 5. DM II  Discharge Condition: Good Disposition: SNF  Diet recommendation: Diabetic diet  Filed Weights   01/19/19 0822  Weight: 47.6 kg    History of present illness:  Patient is a 85 year old female with history of diabetes mellitus, Parkinson's, rheumatoid arthritis, stroke, depression presented to ED with multiple episodes of diarrhea that started around 2 AM in the morning.  History was obtained from the patient who stated that she was in her normal baseline health until around 2 AM in the morning when she started having multiple episodes of watery diarrhea.  Denied any hematochezia or melena.  She also reported abdominal pain 6/10, intermittent, periumbilical this morning.  No nausea or vomiting.  No fevers or chills. Patient reports that she has intermittent constipation and diarrhea issues but this is different.  She feels generalized weakness, dizziness and lightheadedness. No chest pain, cough, states that she has not been on any antibiotics recently.  Hospital Course:  Patient is a 85 year old female from home with diabetes mellitus, Parkinson's disease, rheumatoid arthritis, stroke, depression who presents to the emerge department with weakness, abdominal pain, poor appetite, multiple episodes of diarrhea without hematochezia or melena.  CT abdomen obtained in  the emergency department showed cecal, descending/sigmoid/ascending colon wall thickening suggesting colitis.  C. difficile was ordered but patient's diarrhea resolved before sample could be obtained.  Currently she is denies any abdominal pain.  She was seen by physical therapy and recommended skilled nursing facility on discharge. She has been transitioned to oral antibiotics. She is tolerating her diet well. She is appropriate for discharge to SNF.  Today's assessment: S: The patient is sitting up comfortably in bed. No new complaints. O: Vitals:  Vitals:   01/23/19 2059 01/24/19 0451  BP: 128/75 (!) 145/69  Pulse: 62 (!) 58  Resp: 16 16  Temp: 98.5 F (36.9 C) 98 F (36.7 C)  SpO2: 97% 97%    Constitutional:   The patient is awake, alert, and oriented x 3. No acute distress.s Respiratory:   CTA bilaterally, no w/r/r.   Respiratory effort normal. No retractions or accessory muscle use Cardiovascular:   RRR, no m/r/g  No LE extremity edema    Normal pedal pulses Abdomen:   Abdomen appears normal; no tenderness or masses  No hernias  No HSM Musculoskeletal:   No cyanosis, clubbing, or edema Skin:   No rashes, lesions, ulcers  palpation of skin: no induration or nodules Neurologic:   CN 2-12 intact  Sensation all 4 extremities intact Psychiatric:   judgement and insight appear normal  Mental status o Mood, affect appropriate o Orientation to person, place, time   Discharge Instructions  Discharge Instructions    Activity as tolerated - No restrictions   Complete by: As directed    Call MD for:  persistant nausea and vomiting   Complete by: As directed    Call MD for:  severe uncontrolled pain   Complete by: As directed    Call MD for:  temperature >100.4   Complete by: As directed    Diet Carb Modified   Complete by: As directed    Discharge instructions   Complete by: As directed    Check BMP on 01/28/2019. Report results to facility  physician. Reason is to follow potassium. PT/OT is to evaluate and treat.   Increase activity slowly   Complete by: As directed     Follow up with PCP in 7-10 days after dc from SNF.  Allergies as of 01/24/2019      Reactions   Codeine Other (See Comments)   Hallucinations    Sulfa Antibiotics Swelling      Medication List    STOP taking these medications   Cranberry 400 MG Caps   diphenoxylate-atropine 2.5-0.025 MG tablet Commonly known as: LOMOTIL   omeprazole 20 MG capsule Commonly known as: PRILOSEC   traMADol 5 mg/mL Susp Commonly known as: ULTRAM     TAKE these medications   BIOFREEZE COLORLESS EX 1 application by Other route every 4 (four) hours as needed (pain). Apply to 5% gel to the neck   carbidopa-levodopa 25-100 MG tablet Commonly known as: SINEMET IR Take 2 tablets by mouth 3 (three) times daily.   carvedilol 3.125 MG tablet Commonly known as: COREG Take 3.125 mg by mouth daily.   cefdinir 300 MG capsule Commonly known as: OMNICEF Take 1 capsule (300 mg total) by mouth every 12 (twelve) hours for 7 days.   cholecalciferol 25 MCG (1000 UT) tablet Commonly known as: VITAMIN D3 Take 1,000 Units by mouth daily.   escitalopram 5 MG tablet Commonly known as: LEXAPRO Take 5 mg by mouth 2 (two) times a day.   gabapentin 300 MG capsule Commonly known as: NEURONTIN Take 1 capsule (300 mg total) by mouth at bedtime. Hold for lethargy. What changed:   additional instructions  Another medication with the same name was removed. Continue taking this medication, and follow the directions you see here.   glipiZIDE 5 MG tablet Commonly known as: GLUCOTROL Take 5 mg by mouth daily before breakfast.   hydrochlorothiazide 25 MG tablet Commonly known as: HYDRODIURIL Take 25 mg by mouth daily.   HYDROcodone-acetaminophen 5-325 MG tablet Commonly known as: NORCO/VICODIN Take 1 tablet by mouth every 6 (six) hours as needed for severe pain. Hold for  lethargy. What changed: additional instructions   levothyroxine 25 MCG tablet Commonly known as: SYNTHROID Take 25 mcg by mouth daily before breakfast.   metroNIDAZOLE 500 MG tablet Commonly known as: FLAGYL Take 1 tablet (500 mg total) by mouth every 8 (eight) hours for 6 days.   multivitamin with minerals Tabs tablet Take 1 tablet by mouth daily.   nitroGLYCERIN 0.4 MG SL tablet Commonly known as: NITROSTAT Place 0.4 mg under the tongue every 5 (five) minutes as needed for chest pain.   polyethylene glycol 17 g packet Commonly known as: MIRALAX / GLYCOLAX Take 17 g by mouth daily as needed for mild constipation or moderate constipation.   potassium chloride 20 MEQ packet Commonly known as: KLOR-CON Take 20 mEq by mouth daily.   Systane 0.4-0.3 % Soln Generic drug: Polyethyl Glycol-Propyl Glycol Place 1 drop into both eyes 2 (two) times daily as needed.      Allergies  Allergen Reactions   Codeine Other (See Comments)    Hallucinations    Sulfa Antibiotics Swelling    The results of significant  diagnostics from this hospitalization (including imaging, microbiology, ancillary and laboratory) are listed below for reference.    Significant Diagnostic Studies: Ct Abdomen Pelvis W Contrast  Result Date: 01/19/2019 CLINICAL DATA:  Multiple episodes of diarrhea. Diffuse abdominal pain. EXAM: CT ABDOMEN AND PELVIS WITH CONTRAST TECHNIQUE: Multidetector CT imaging of the abdomen and pelvis was performed using the standard protocol following bolus administration of intravenous contrast. CONTRAST:  OMNIPAQUE IOHEXOL 300 MG/ML  SOLN COMPARISON:  None. FINDINGS: Lower chest: Normal heart size. Lung bases are clear. No pleural effusion. Hepatobiliary: Liver is normal in size and contour. Subcentimeter too small to characterize low-attenuation lesion hepatic dome (image 11; series 2). Multiple stones within the gallbladder lumen. No intrahepatic or extrahepatic biliary ductal  dilatation. Pancreas: Atrophic Spleen: Unremarkable Adrenals/Urinary Tract: Normal adrenal glands. Kidneys enhance symmetrically with contrast. No hydronephrosis. Urinary bladder is unremarkable. Stomach/Bowel: Normal morphology of the stomach. No evidence for small bowel obstruction. No free fluid or free intraperitoneal air. There is wall thickening of the descending and sigmoid colon. Sigmoid colonic diverticulosis. Additionally, there is mild wall thickening of the cecum and ascending colon. Vascular/Lymphatic: Normal caliber abdominal aorta with peripheral calcified atherosclerotic plaque. No retroperitoneal lymphadenopathy. Reproductive: Status post hysterectomy. Small amount of gas with vaginal cuff. Other: None. Musculoskeletal: No aggressive or acute appearing osseous lesions. IMPRESSION: There is wall thickening of the colon most compatible with colitis. Small amount of gas within the vagina which is nonspecific in etiology. Recommend clinical correlation to exclude the possibility colovaginal fistula. Electronically Signed   By: Annia Belt M.D.   On: 01/19/2019 10:17    Microbiology: Recent Results (from the past 240 hour(s))  SARS Coronavirus 2 (CEPHEID - Performed in [facility] hospital lab), Hosp Order     Status: None   Collection Time: 01/19/19 10:24 AM   Specimen: Nasopharyngeal Swab  Result Value Ref Range Status   SARS Coronavirus 2 NEGATIVE NEGATIVE Final    Comment: (NOTE) If result is NEGATIVE SARS-CoV-2 target nucleic acids are NOT DETECTED. The SARS-CoV-2 RNA is generally detectable in upper and lower  respiratory specimens during the acute phase of infection. The lowest  concentration of SARS-CoV-2 viral copies this assay can detect is 250  copies / mL. A negative result does not preclude SARS-CoV-2 infection  and should not be used as the sole basis for treatment or other  patient management decisions.  A negative result may occur with  improper specimen collection /  handling, submission of specimen other  than nasopharyngeal swab, presence of viral mutation(s) within the  areas targeted by this assay, and inadequate number of viral copies  (<250 copies / mL). A negative result must be combined with clinical  observations, patient history, and epidemiological information. If result is POSITIVE SARS-CoV-2 target nucleic acids are DETECTED. The SARS-CoV-2 RNA is generally detectable in upper and lower  respiratory specimens dur ing the acute phase of infection.  Positive  results are indicative of active infection with SARS-CoV-2.  Clinical  correlation with patient history and other diagnostic information is  necessary to determine patient infection status.  Positive results do  not rule out bacterial infection or co-infection with other viruses. If result is PRESUMPTIVE POSTIVE SARS-CoV-2 nucleic acids MAY BE PRESENT.   A presumptive positive result was obtained on the submitted specimen  and confirmed on repeat testing.  While 2019 novel coronavirus  (SARS-CoV-2) nucleic acids may be present in the submitted sample  additional confirmatory testing may be necessary for epidemiological  and /  or clinical management purposes  to differentiate between  SARS-CoV-2 and other Sarbecovirus currently known to infect humans.  If clinically indicated additional testing with an alternate test  methodology [phone]([id number]) is advised. The SARS-CoV-2 RNA is generally  detectable in upper and lower respiratory sp ecimens during the acute  phase of infection. The expected result is Negative. Fact Sheet for Patients:  BoilerBrush.com.cyhttps://www.fda.gov/media/136312/download Fact Sheet for Healthcare Providers: https://pope.com/https://www.fda.gov/media/136313/download This test is not yet approved or cleared by the [place] FDA and has been authorized for detection and/or diagnosis of SARS-CoV-2 by FDA under an Emergency Use Authorization (EUA).  This EUA will remain in effect (meaning this  test can be used) for the duration of the COVID-19 declaration under Section 564(b)(1) of the Act, 21 U.S.C. section 360bbb-3(b)(1), unless the authorization is terminated or revoked sooner. Performed at [facility], [address] [place], [place] [id number]      Labs: Basic Metabolic Panel: Recent Labs  Lab 01/19/19 [phone] 01/19/19 1617 01/20/19 0611 01/20/19 1627 01/21/19 0913 01/22/19 0535  NA 137  --  139 139 140 138  K 2.8* 3.0* 2.6* 3.3* 3.3* 3.5  CL 100  --  107 105 108 106  CO2 26  --  24 25 23 26   GLUCOSE 99  --  68* 128* 147* 117*  BUN 22  --  12 9 7* 11  CREATININE 0.72 0.69 0.64 0.69 0.63 0.67  CALCIUM 8.9  --  8.1* 8.4* 8.3* 8.4*  MG 1.8  --   --  2.3  --   --    Liver Function Tests: Recent Labs  Lab 01/19/19 0836  AST 18  ALT 6  ALKPHOS 57  BILITOT 0.8  PROT 7.0  ALBUMIN 3.7   Recent Labs  Lab 01/19/19 0836  LIPASE 20   No results for input(s): AMMONIA in the last 168 hours. CBC: Recent Labs  Lab 01/19/19 0836 01/19/19 1617 01/20/19 0611 01/22/19 0535  WBC 13.4* 10.3 7.6 8.8  HGB 12.1 11.1* 9.9* 11.0*  HCT 38.2 35.1* 30.3* 35.2*  MCV 95.7 96.4 97.1 96.7  PLT 273 282 241 255   Cardiac Enzymes: No results for input(s): CKTOTAL, CKMB, CKMBINDEX, TROPONINI in the last 168 hours. BNP: BNP (last 3 results) No results for input(s): BNP in the last 8760 hours.  ProBNP (last 3 results) No results for input(s): PROBNP in the last 8760 hours.  CBG: Recent Labs  Lab 01/23/19 0745 01/23/19 1154 01/23/19 1630 01/23/19 2101 01/24/19 0733  GLUCAP 92 193* 186* 156* 104*    Principal Problem:   Colitis Active Problems:   Parkinsonism (HCC)   Diarrhea   Hypokalemia   Prolonged QT interval   Time coordinating discharge: 42 minutes  Signed:        Cylan Borum, DO [facility]  01/24/2019, 11:28 AM

## 2019-01-24 NOTE — Progress Notes (Signed)
Ashlee Reid to be D/C'd Skilled nursing facility per MD order.  Discussed prescriptions and follow up appointments with the patient. Prescriptions given to patient, medication list explained in detail. Pt verbalized understanding.  Allergies as of 01/24/2019      Reactions   Codeine Other (See Comments)   Hallucinations    Sulfa Antibiotics Swelling      Medication List    STOP taking these medications   Cranberry 400 MG Caps   diphenoxylate-atropine 2.5-0.025 MG tablet Commonly known as: LOMOTIL   omeprazole 20 MG capsule Commonly known as: PRILOSEC   traMADol 5 mg/mL Susp Commonly known as: ULTRAM     TAKE these medications   BIOFREEZE COLORLESS EX 1 application by Other route every 4 (four) hours as needed (pain). Apply to 5% gel to the neck   carbidopa-levodopa 25-100 MG tablet Commonly known as: SINEMET IR Take 2 tablets by mouth 3 (three) times daily. Notes to patient: Tonight at bedtime   carvedilol 3.125 MG tablet Commonly known as: COREG Take 3.125 mg by mouth daily.   cefdinir 300 MG capsule Commonly known as: OMNICEF Take 1 capsule (300 mg total) by mouth every 12 (twelve) hours for 7 days.   cholecalciferol 25 MCG (1000 UT) tablet Commonly known as: VITAMIN D3 Take 1,000 Units by mouth daily.   escitalopram 5 MG tablet Commonly known as: LEXAPRO Take 5 mg by mouth 2 (two) times a day.   gabapentin 300 MG capsule Commonly known as: NEURONTIN Take 1 capsule (300 mg total) by mouth at bedtime. Hold for lethargy. What changed:   additional instructions  Another medication with the same name was removed. Continue taking this medication, and follow the directions you see here.   glipiZIDE 5 MG tablet Commonly known as: GLUCOTROL Take 5 mg by mouth daily before breakfast.   hydrochlorothiazide 25 MG tablet Commonly known as: HYDRODIURIL Take 25 mg by mouth daily.   HYDROcodone-acetaminophen 5-325 MG tablet Commonly known as:  NORCO/VICODIN Take 1 tablet by mouth every 6 (six) hours as needed for severe pain. Hold for lethargy. What changed: additional instructions   levothyroxine 25 MCG tablet Commonly known as: SYNTHROID Take 25 mcg by mouth daily before breakfast.   metroNIDAZOLE 500 MG tablet Commonly known as: FLAGYL Take 1 tablet (500 mg total) by mouth every 8 (eight) hours for 6 days. Notes to patient: Tonight at bedtime   multivitamin with minerals Tabs tablet Take 1 tablet by mouth daily.   nitroGLYCERIN 0.4 MG SL tablet Commonly known as: NITROSTAT Place 0.4 mg under the tongue every 5 (five) minutes as needed for chest pain.   polyethylene glycol 17 g packet Commonly known as: MIRALAX / GLYCOLAX Take 17 g by mouth daily as needed for mild constipation or moderate constipation.   potassium chloride 20 MEQ packet Commonly known as: KLOR-CON Take 20 mEq by mouth daily.   Systane 0.4-0.3 % Soln Generic drug: Polyethyl Glycol-Propyl Glycol Place 1 drop into both eyes 2 (two) times daily as needed.       Vitals:   01/24/19 0451 01/24/19 1345  BP: (!) 145/69 130/63  Pulse: (!) 58 60  Resp: 16 16  Temp: 98 F (36.7 C) 98.2 F (36.8 C)  SpO2: 97% 95%    Skin clean, dry and intact without evidence of skin break down, no evidence of skin tears noted. IV catheter discontinued intact. Site without signs and symptoms of complications. Dressing and pressure applied. Pt denies pain at this time. No complaints  noted.  Report called to RN at [facility] Patient to transfer to facility via [facility].   Ashlee Reid S 01/24/2019 5:11 PM

## 2019-01-24 NOTE — TOC Progression Note (Signed)
Transition of Care [facility]) - Progression Note    Patient Details  Name: Ashlee Reid MRN: [id number] Date of Birth: 05/11/1926  Transition of Care [facility]) CM/SW Contact  Servando Snare, [place] Phone Number: 01/24/2019, 10:12 AM  Clinical Narrative:   Patient has auth and PASRR. Patient can transfer to SNF when stable. LCSW updated attending via secure chat.     Expected Discharge Plan: [place] Barriers to Discharge: Continued Medical Work up, Ship broker  Expected Discharge Plan and Services Expected Discharge Plan: [place] Choice: [place] arrangements for the past 2 months: Single Family Home                                       Social Determinants of Health (SDOH) Interventions    Readmission Risk Interventions No flowsheet data found.

## 2019-02-13 ENCOUNTER — Telehealth: Payer: Self-pay | Admitting: *Deleted

## 2019-02-13 NOTE — Telephone Encounter (Signed)
I spoke to the patient's daughter, on [place], who informed me her mother was moved to long term care at [facility] on 02/08/2019.  She plans to have them contact our office to schedule her yearly follow up, once she is more settled.  This may be a couple more months.  When [facility] calls back, we need to ask COVID-19 screening questions and make sure the facility does not have active cases.  She can be scheduled with Butler Denmark, NP or Dr. Krista Blue.

## 2019-05-15 ENCOUNTER — Encounter (INDEPENDENT_AMBULATORY_CARE_PROVIDER_SITE_OTHER): Payer: Medicare Other | Admitting: Ophthalmology

## 2019-10-22 ENCOUNTER — Encounter (HOSPITAL_COMMUNITY): Payer: Self-pay | Admitting: Emergency Medicine

## 2019-10-22 ENCOUNTER — Emergency Department (HOSPITAL_COMMUNITY): Payer: Medicare Other

## 2019-10-22 ENCOUNTER — Emergency Department (HOSPITAL_COMMUNITY)
Admission: EM | Admit: 2019-10-22 | Discharge: 2019-10-22 | Disposition: A | Discharge status: Home / Self Care | Payer: Medicare Other | Attending: Emergency Medicine | Admitting: Emergency Medicine

## 2019-10-22 DIAGNOSIS — Y92122 Bedroom in nursing home as the place of occurrence of the external cause: Secondary | ICD-10-CM | POA: Diagnosis not present

## 2019-10-22 DIAGNOSIS — Y939 Activity, unspecified: Secondary | ICD-10-CM | POA: Diagnosis not present

## 2019-10-22 DIAGNOSIS — E119 Type 2 diabetes mellitus without complications: Secondary | ICD-10-CM | POA: Insufficient documentation

## 2019-10-22 DIAGNOSIS — M25522 Pain in left elbow: Secondary | ICD-10-CM | POA: Insufficient documentation

## 2019-10-22 DIAGNOSIS — M25512 Pain in left shoulder: Secondary | ICD-10-CM | POA: Diagnosis not present

## 2019-10-22 DIAGNOSIS — Y999 Unspecified external cause status: Secondary | ICD-10-CM | POA: Insufficient documentation

## 2019-10-22 DIAGNOSIS — G20 Parkinson's disease: Secondary | ICD-10-CM | POA: Diagnosis not present

## 2019-10-22 DIAGNOSIS — S50311A Abrasion of right elbow, initial encounter: Secondary | ICD-10-CM | POA: Insufficient documentation

## 2019-10-22 DIAGNOSIS — W19XXXA Unspecified fall, initial encounter: Secondary | ICD-10-CM

## 2019-10-22 DIAGNOSIS — Z79899 Other long term (current) drug therapy: Secondary | ICD-10-CM | POA: Diagnosis not present

## 2019-10-22 DIAGNOSIS — S59901A Unspecified injury of right elbow, initial encounter: Secondary | ICD-10-CM | POA: Diagnosis present

## 2019-10-22 DIAGNOSIS — Z7984 Long term (current) use of oral hypoglycemic drugs: Secondary | ICD-10-CM | POA: Insufficient documentation

## 2019-10-22 DIAGNOSIS — W06XXXA Fall from bed, initial encounter: Secondary | ICD-10-CM | POA: Insufficient documentation

## 2019-10-22 DIAGNOSIS — Z8673 Personal history of transient ischemic attack (TIA), and cerebral infarction without residual deficits: Secondary | ICD-10-CM | POA: Diagnosis not present

## 2019-10-22 LAB — CBC
HCT: 37.8 % (ref 36.0–46.0)
Hemoglobin: 11.9 g/dL — ABNORMAL LOW (ref 12.0–15.0)
MCH: 29.4 pg (ref 26.0–34.0)
MCHC: 31.5 g/dL (ref 30.0–36.0)
MCV: 93.3 fL (ref 80.0–100.0)
Platelets: 325 10*3/uL (ref 150–400)
RBC: 4.05 MIL/uL (ref 3.87–5.11)
RDW: 14.2 % (ref 11.5–15.5)
WBC: 10.5 10*3/uL (ref 4.0–10.5)
nRBC: 0 % (ref 0.0–0.2)

## 2019-10-22 LAB — BASIC METABOLIC PANEL
Anion gap: 11 (ref 5–15)
BUN: 32 mg/dL — ABNORMAL HIGH (ref 8–23)
CO2: 27 mmol/L (ref 22–32)
Calcium: 9.1 mg/dL (ref 8.9–10.3)
Chloride: 101 mmol/L (ref 98–111)
Creatinine, Ser: 0.65 mg/dL (ref 0.44–1.00)
GFR calc Af Amer: >60 mL/min (ref >60)
GFR calc non Af Amer: >60 mL/min (ref >60)
Glucose, Bld: 149 mg/dL — ABNORMAL HIGH (ref 70–99)
Potassium: 3.1 mmol/L — ABNORMAL LOW (ref 3.5–5.1)
Sodium: 139 mmol/L (ref 135–145)

## 2019-10-22 NOTE — Discharge Instructions (Addendum)
You were evaluated in the Emergency Department and after careful evaluation, we did not find any emergent condition requiring admission or further testing in the hospital.  Your exam/testing today is overall reassuring.  Please return to the Emergency Department if you experience any worsening of your condition.  We encourage you to follow up with a primary care provider.  Thank you for allowing us to be a part of your care. 

## 2019-10-22 NOTE — ED Notes (Signed)
Patient transported to CT/XR ?

## 2019-10-22 NOTE — ED Notes (Signed)
[facility] called  

## 2019-10-22 NOTE — ED Provider Notes (Signed)
  Provider Note MRN:  [id number]  Arrival date & time: 10/22/19    ED Course and Medical Decision Making  Assumed care from Dr. Lynelle Doctor at shift change.  Fall at care facility, no significant injuries, awaiting laboratory assessment and likely discharge thereafter.  4:15 PM update: Labs reassuring, no acute injuries on imaging studies, appropriate for discharge.  Procedures  Final Clinical Impressions(s) / ED Diagnoses     ICD-10-CM   1. Fall, initial encounter  [facility]     ED Discharge Orders    None        Discharge Instructions     You were evaluated in the Emergency Department and after careful evaluation, we did not find any emergent condition requiring admission or further testing in the hospital.  Your exam/testing today is overall reassuring.  Please return to the Emergency Department if you experience any worsening of your condition.  We encourage you to follow up with a primary care provider.  Thank you for allowing [place] to be a part of your care.     Elmer Sow. Pilar Plate, MD [facility] Emergency Medicine [facility] [email or web address]    Sabas Sous, MD 10/22/19 [phone]

## 2019-10-22 NOTE — ED Triage Notes (Signed)
Per EMS-coming from [facility]-unwitnessed fall out of bed-skin tear to right elbow/arm-patient denies any pain/complaints to EMS-although complained of neck pain to staff-at [place] line mentally per staff

## 2019-10-22 NOTE — ED Provider Notes (Signed)
[facility] DEPT Provider Note   CSN: [id number] Arrival date & time: 10/22/19  1324     History Chief Complaint  Patient presents with  . Fall    Ashlee Reid is a 86 y.o. female.  HPI   Patient presented to the emergency room for evaluation after a fall.  Patient is a resident of a skilled nursing facility.  She had an unwitnessed fall out of bed.  They noted a small abrasion to her right elbow.  Patient herself does not recall what happened.  She tells me she did not actually fall but ended up leaning against a wall.  She cannot tell me why that occurred.  She denies any headache.  She is complaining of pain in her left shoulder.  She denies chest pain or shortness of breath.  No fevers or chills.  Past Medical History:  Diagnosis Date  . Depression   . Diabetes ([place])   . Diabetes ([place])   . Parkinson disease ([place])   . Peripheral neuropathy   . Rheumatoid arteritis ([place])   . Stroke [facility])     Patient Active Problem List   Diagnosis Date Noted  . Colitis 01/19/2019  . Diarrhea 01/19/2019  . Hypokalemia 01/19/2019  . Prolonged QT interval 01/19/2019  . Slurred speech 04/13/2017  . Parkinsonism ([place]) 11/17/2015  . Abnormality of gait 11/17/2015    Past Surgical History:  Procedure Laterality Date  . ABDOMINAL HYSTERECTOMY    . APPENDECTOMY    . TONSILLECTOMY AND ADENOIDECTOMY       OB History   No obstetric history on file.     Family History  Problem Relation Age of Onset  . Congestive Heart Failure Father   . Alzheimer's disease Mother   . Stroke Mother   . Diabetes Daughter     Social History   Tobacco Use  . Smoking status: Never Smoker  . Smokeless tobacco: Current User    Types: Chew  Substance Use Topics  . Alcohol use: No    Alcohol/week: 0.0 standard drinks  . Drug use: No    Home Medications Prior to Admission medications   Medication Sig Start Date End Date Taking? Authorizing Provider    acetaminophen (TYLENOL) 325 MG tablet Take 650 mg by mouth every 6 (six) hours as needed for mild pain or fever.   Yes [provider]  Amino Acids-Protein Hydrolys (FEEDING SUPPLEMENT, PRO-STAT SUGAR FREE 64,) LIQD Take 30 mLs by mouth 3 (three) times daily with meals.   Yes [provider]  carbidopa-levodopa (SINEMET IR) 25-100 MG tablet Take 2 tablets by mouth 3 (three) times daily. 03/23/18  Yes Marcial Pacas, MD  cholecalciferol (VITAMIN D3) 25 MCG (1000 UT) tablet Take 1,000 Units by mouth daily.   Yes [provider]  escitalopram (LEXAPRO) 10 MG tablet Take 10 mg by mouth daily.    Yes [provider]  gabapentin (NEURONTIN) 300 MG capsule Take 1 capsule (300 mg total) by mouth at bedtime. Hold for lethargy. 01/24/19  Yes Swayze, Ava, DO  glipiZIDE (GLUCOTROL XL) 2.5 MG 24 hr tablet Take 2.5 mg by mouth daily with breakfast.   Yes [provider]  hydrochlorothiazide (HYDRODIURIL) 12.5 MG tablet Take 12.5 mg by mouth daily.    Yes [provider]  Menthol, Topical Analgesic, (BIOFREEZE COLORLESS EX) 1 application by Other route every 4 (four) hours as needed (pain). Apply to 5% gel to the neck   Yes [provider]  mirtazapine (  REMERON) 7.5 MG tablet Take 7.5 mg by mouth at bedtime.   Yes [provider]  nitroGLYCERIN (NITROSTAT) 0.4 MG SL tablet Place 0.4 mg under the tongue every 5 (five) minutes as needed for chest pain.   Yes [provider]  Polyethyl Glycol-Propyl Glycol (SYSTANE) 0.4-0.3 % SOLN Place 1 drop into both eyes 2 (two) times daily as needed.   Yes [provider]  polyethylene glycol (MIRALAX / GLYCOLAX) packet Take 17 g by mouth daily as needed for mild constipation or moderate constipation.   Yes [provider]  potassium chloride (KLOR-CON) 20 MEQ packet Take 20 mEq by mouth daily.   Yes [provider]  HYDROcodone-acetaminophen (NORCO/VICODIN) 5-325 MG tablet Take 1  tablet by mouth every 6 (six) hours as needed for severe pain. Hold for lethargy. Patient not taking: Reported on 10/22/2019 01/24/19   Swayze, Ava, DO    Allergies    Codeine and Sulfa antibiotics  Review of Systems   Review of Systems  Physical Exam Updated Vital Signs BP (!) 148/65   Pulse 65   Temp 98.2 F (36.8 C) (Oral)   Resp 18   SpO2 99%   Physical Exam Vitals and nursing note reviewed.  Constitutional:      Appearance: She is well-developed. She is not diaphoretic.     Comments: Elderly, frail  HENT:     Head: Normocephalic and atraumatic.     Right Ear: External ear normal.     Left Ear: External ear normal.  Eyes:     General: No scleral icterus.       Right eye: No discharge.        Left eye: No discharge.     Conjunctiva/sclera: Conjunctivae normal.  Neck:     Trachea: No tracheal deviation.  Cardiovascular:     Rate and Rhythm: Normal rate and regular rhythm.  Pulmonary:     Effort: Pulmonary effort is normal. No respiratory distress.     Breath sounds: Normal breath sounds. No stridor. No wheezing or rales.  Abdominal:     General: Bowel sounds are normal. There is no distension.     Palpations: Abdomen is soft.     Tenderness: There is no abdominal tenderness. There is no guarding or rebound.  Musculoskeletal:     Right shoulder: Normal. No deformity. Normal range of motion.     Left shoulder: Tenderness present. No swelling or deformity. Decreased range of motion.     Left upper arm: No deformity ( ) or tenderness.     Right elbow: No deformity. Normal range of motion.     Left elbow: No deformity. Decreased range of motion. Tenderness present.     Cervical back: Normal and neck supple.     Thoracic back: Normal.     Lumbar back: Normal.     Comments: c collar in place, small abrasion right elbow, no ttp, full rom  Skin:    General: Skin is warm and dry.     Findings: No rash.  Neurological:     Mental Status: She is alert.     Cranial Nerves:  No cranial nerve deficit (no facial droop, extraocular movements intact, no slurred speech).     Sensory: No sensory deficit.     Motor: No abnormal muscle tone or seizure activity.     Coordination: Coordination normal.     ED Results / Procedures / Treatments   Labs (all labs ordered are listed, but only abnormal results  are displayed) Labs Reviewed  CBC - Abnormal; Notable for the following components:      Result Value   Hemoglobin 11.9 (*)    All other components within normal limits  BASIC METABOLIC PANEL    EKG EKG Interpretation  Date/Time:  Tuesday October 22 2019 15:32:18 EST Ventricular Rate:  67 PR Interval:  162 QRS Duration: 84 QT Interval:  322 QTC Calculation: 340 R Axis:   -32 Text Interpretation: Normal sinus rhythm Left axis deviation ST & T wave abnormality, consider inferolateral ischemia Abnormal ECG No significant change since last tracing Confirmed by Linwood Dibbles [phone]) on 10/22/2019 3:39:42 PM   Radiology DG Elbow Complete Left  Result Date: 10/22/2019 CLINICAL DATA:  Larey Seat, pain EXAM: LEFT ELBOW - COMPLETE 3+ VIEW COMPARISON:  None. FINDINGS: Frontal, bilateral oblique, lateral views of the left elbow are obtained. Evaluation is limited due to difficulty positioning the patient. No obvious fracture. Alignment is anatomic. No joint effusion. IMPRESSION: 1. Unremarkable exam limited by patient positioning. Electronically Signed   By: Sharlet Salina M.D.   On: 10/22/2019 14:56   CT Head Wo Contrast  Result Date: 10/22/2019 CLINICAL DATA:  Nursing home patient post unwitnessed fall out of bed. Neck pain. EXAM: CT HEAD WITHOUT CONTRAST TECHNIQUE: Contiguous axial images were obtained from the base of the skull through the vertex without intravenous contrast. COMPARISON:  Head and cervical spine CT 10/25/2017 FINDINGS: Brain: Left occipital encephalomalacia and hypodensities consistent with remote infarct, however new from 2019. No encephalomalacia in the left  frontal lobe. Remote lacunar infarct in the right basal ganglia, also new. No acute hemorrhage, mass effect, or midline shift. Generalized atrophy and chronic small vessel ischemia. No subdural or extra-axial collection. Vascular: Atherosclerosis of skullbase vasculature without hyperdense vessel or abnormal calcification. Skull: No fracture or focal lesion. Sinuses/Orbits: No acute findings. Paranasal sinuses are clear. Bilateral cataract resection. Mastoid air cells are hypo pneumatized. Other: None. IMPRESSION: 1. No acute intracranial abnormality. No skull fracture. 2. Encephalomalacia in the left occipital lobe and left frontal lobe. Findings consistent with remote infarcts, however new from 2019 exam. Background atrophy and chronic small vessel ischemia. Electronically Signed   By: Narda Rutherford M.D.   On: 10/22/2019 15:18   CT Cervical Spine Wo Contrast  Result Date: 10/22/2019 CLINICAL DATA:  Nursing home patient post unwitnessed fall out of bed. Neck pain. EXAM: CT CERVICAL SPINE WITHOUT CONTRAST TECHNIQUE: Multidetector CT imaging of the cervical spine was performed without intravenous contrast. Multiplanar CT image reconstructions were also generated. COMPARISON:  CT 10/25/2017 FINDINGS: Alignment: Patient is tilted in the scanner. Grade 1 anterolisthesis of C4 on C5 is unchanged from prior exam. No acute or traumatic subluxation. Mild scoliotic curvature of the cervicothoracic junction. Skull base and vertebrae: No acute fracture. Vertebral body heights are maintained. The dens and skull base are intact. Chronic degenerative fragmentation at C1-C2 articulation. Soft tissues and spinal canal: No prevertebral fluid or swelling. No visible canal hematoma. Disc levels: Diffuse degenerative disc disease with disc space narrowing and endplate spurring. Multilevel facet hypertrophy. Degenerative changes are similar to prior exam. Upper chest: No acute findings. Other: Mild carotid calcifications.  IMPRESSION: 1. No acute fracture or subluxation of the cervical spine. 2. Stable multilevel degenerative disc disease and facet hypertrophy. Electronically Signed   By: Narda Rutherford M.D.   On: 10/22/2019 15:21   DG Shoulder Left  Result Date: 10/22/2019 CLINICAL DATA:  Larey Seat, pain, limited range of motion EXAM: LEFT SHOULDER - 2+ VIEW COMPARISON:  08/08/2017 FINDINGS: Frontal and transscapular views of the left shoulder are obtained. The transscapular view is essentially nondiagnostic due to difficulty positioning the patient. There is severe glenohumeral osteoarthritis with complete loss of joint space, eburnation, and marked marginal osteophyte formation. This has progressed since prior study. No evidence of dislocation. Mild acromioclavicular hypertrophic changes are stable. IMPRESSION: 1. Severe glenohumeral osteoarthritis. 2. No acute fracture. 3. Limited study due to positioning. Electronically Signed   By: Sharlet Salina M.D.   On: 10/22/2019 14:55    Procedures Procedures (including critical care time)  Medications Ordered in ED Medications - No data to display  ED Course  I have reviewed the triage vital signs and the nursing notes.  Pertinent labs & imaging results that were available during my care of the patient were reviewed by me and considered in my medical decision making (see chart for details).    MDM Rules/Calculators/A&P                      Pt presented to the ED after being found next to her bed.  Shoulder films show severe arthritis.  Likely cause of her shoulder discomfort, no signs of acute injury.  Xrays and labs pending.  Dr Pilar Plate will follow up on results.  If negative likley will be safe for dc to nursing facility.   Linwood Dibbles, MD 10/22/19 [phone]

## 2020-02-19 ENCOUNTER — Non-Acute Institutional Stay: Payer: Self-pay | Admitting: Internal Medicine

## 2020-02-23 NOTE — Progress Notes (Signed)
Therapist, nutritional Palliative Care Consult Note Telephone: [phone]  Fax: [phone]  PATIENT NAME: Ashlee Reid DOB: [birth year] MRN: [id number]  PRIMARY CARE PROVIDER:   Henrine Screws, MD  REFERRING PROVIDER: Dr. Blenda Mounts  RESPONSIBLE PARTYLupita Raider Daughter [phone]  [phone]       RECOMMENDATIONS and PLAN:  Palliative care encounter  Z51.5  1.Advance care planning:   Explanation of palliative and hospice care to daughter Ashlee Reid. Goals of care discussed which includeReviewed MOST form and confirmed pt's selections with her.  Her advanced directives are DNAR/DNI, comfort measures, no IV fluids,  no antibiotics, no tube feedings. MOST form updated and uploaded into chart. Discussion with Dr. Montez Morita who agrees that patient is appropriate for hospice care due to advanced Parkinson disease.   Palliative care will continue to f/u with patient.   2.  Parkinson disease dementia:   Advanced.  FAST stage 7c.  Consider transition to hospice for comfort care.   3.  Dysphagia:  High risk for aspiration.  Encouraged aspiration precautions.  Follow dietary recommendations as per dietician.  Comfort feedings.   I spent 60 minutes providing this consultation,  from 1130 to 1230. More than 50% of the time in this consultation was spent coordinating communication with clinical staff, Uvaldo Bristle, NP, Dr. Kirt Boys and pt's daughter.    HISTORY OF PRESENT ILLNESS:  Ashlee Reid is a 86 y.o. year old female with multiple medical problems including Parkinson Disease, CVA and T2DM, Colitis and Encephalomalacia in the left occipital lobe and left frontal lobe. Findings consistent with remote infarcts, however new from 2019 exam. Background atrophy and chronic small vessel ischemia per head CT scan from March 2021.Marland Kitchen Clinical staff reports that within the past 2-3 weeks, pt has experienced hypotension, decreased nutritional and hydration intake to  sips and bites and requiring several episodes of intravenous hydration.  She is bedridden and requires assistance with all ADLs.  She is incontinent of B&B. Telephone conversation with daughter reports that she has decided to discontinue an active treatment plan and focus on comfort care only, providing no further IV hydration nor return to the hospital.   Palliative Care was asked to help address goals of care.   CODE STATUS:  DNAR/DNI  PPS: 20% HOSPICE ELIGIBILITY/DIAGNOSIS: YES/ End stage Parkinson disease  PAST MEDICAL HISTORY:  Past Medical History:  Diagnosis Date   Depression    Diabetes ([facility])    Diabetes ([facility])    Parkinson disease ([facility])    Peripheral neuropathy    Rheumatoid arteritis ([facility])    Stroke ([facility])      ALLERGIES:  Allergies  Allergen Reactions   Codeine Other (See Comments)    Hallucinations    Sulfa Antibiotics Swelling     PERTINENT MEDICATIONS:  Outpatient Encounter Medications as of 02/19/2020  Medication Sig   acetaminophen (TYLENOL) 325 MG tablet Take 650 mg by mouth every 6 (six) hours as needed for mild pain or fever.   Amino Acids-Protein Hydrolys (FEEDING SUPPLEMENT, PRO-STAT SUGAR FREE 64,) LIQD Take 30 mLs by mouth 3 (three) times daily with meals.   carbidopa-levodopa (SINEMET IR) 25-100 MG tablet Take 2 tablets by mouth 3 (three) times daily.   cholecalciferol (VITAMIN D3) 25 MCG (1000 UT) tablet Take 1,000 Units by mouth daily.   escitalopram (LEXAPRO) 10 MG tablet Take 10 mg by mouth daily.    gabapentin (NEURONTIN) 300 MG capsule Take 1 capsule (300 mg total) by mouth at bedtime. Hold  for lethargy.   glipiZIDE (GLUCOTROL XL) 2.5 MG 24 hr tablet Take 2.5 mg by mouth daily with breakfast.   hydrochlorothiazide (HYDRODIURIL) 12.5 MG tablet Take 12.5 mg by mouth daily.    HYDROcodone-acetaminophen (NORCO/VICODIN) 5-325 MG tablet Take 1 tablet by mouth every 6 (six) hours as needed for severe pain. Hold for lethargy. (Patient not  taking: Reported on 10/22/2019)   Menthol, Topical Analgesic, (BIOFREEZE COLORLESS EX) 1 application by Other route every 4 (four) hours as needed (pain). Apply to 5% gel to the neck   mirtazapine (REMERON) 7.5 MG tablet Take 7.5 mg by mouth at bedtime.   nitroGLYCERIN (NITROSTAT) 0.4 MG SL tablet Place 0.4 mg under the tongue every 5 (five) minutes as needed for chest pain.   Polyethyl Glycol-Propyl Glycol (SYSTANE) 0.4-0.3 % SOLN Place 1 drop into both eyes 2 (two) times daily as needed.   polyethylene glycol (MIRALAX / GLYCOLAX) packet Take 17 g by mouth daily as needed for mild constipation or moderate constipation.   potassium chloride (KLOR-CON) 20 MEQ packet Take 20 mEq by mouth daily.   No facility-administered encounter medications on file as of 02/19/2020.    PHYSICAL EXAM:   General: NAD, frail appearing, cachectic elderly female in a fetal position Cardiovascular: regular rate and rhythm Pulmonary: clear ant fields Abdomen: soft, nontender, + bowel sounds, concave GU: no suprapubic tenderness Extremities: no edema, no muscle mass Skin: exposed skin is intact Neurological: somnolent but arouses easily,  Speaks in 2-3 words.  Unable to determine orientation due to cognitive decline  Margaretha Sheffield, NP-C

## 2020-04-15 DEATH — deceased

## 2021-09-28 IMAGING — CT CT CERVICAL SPINE W/O CM
3 of 4 series · 12 of 33 positions shown, 14 images · non-contrast
Comparison: CT 10/25/2017

CLINICAL DATA: [HOSPITAL] patient post unwitnessed fall out of
bed. Neck pain.

EXAM:
CT CERVICAL SPINE WITHOUT CONTRAST
TECHNIQUE: Multidetector CT imaging of the cervical spine was performed without
intravenous contrast. Multiplanar CT image reconstructions were also
generated.

[Series 6: sagittal bone · sagittal · 0.30mm/px · 5 of 61 slices shown, 6 images]
[im 21/61  bone]
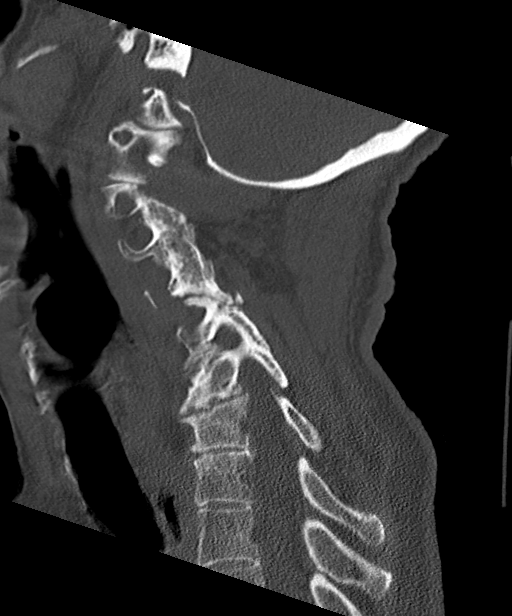
[im 26/61  bone]
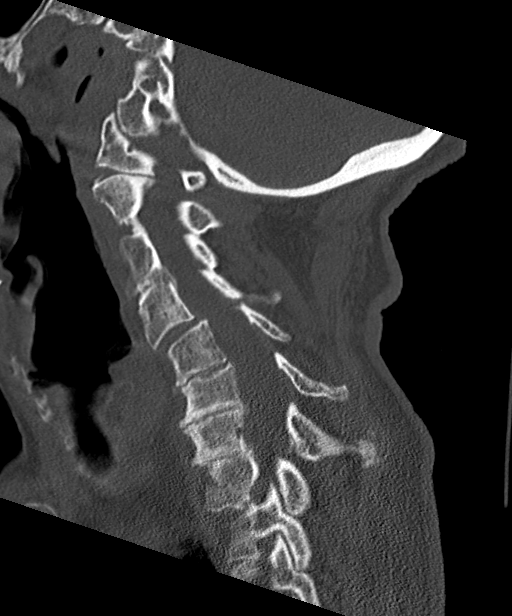
[im 31/61  soft-tissue]
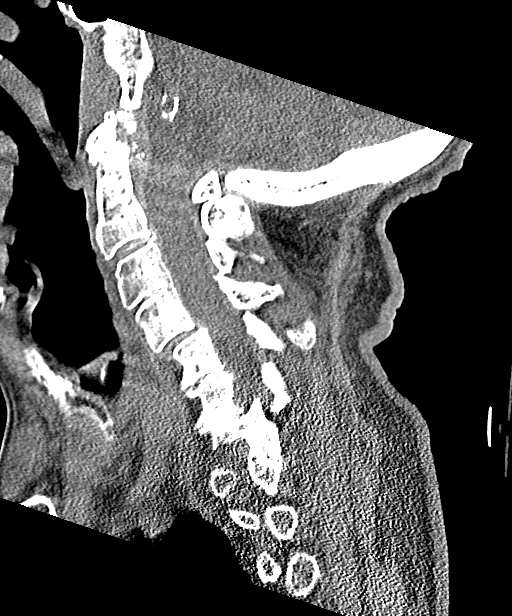
[im 31/61  bone]
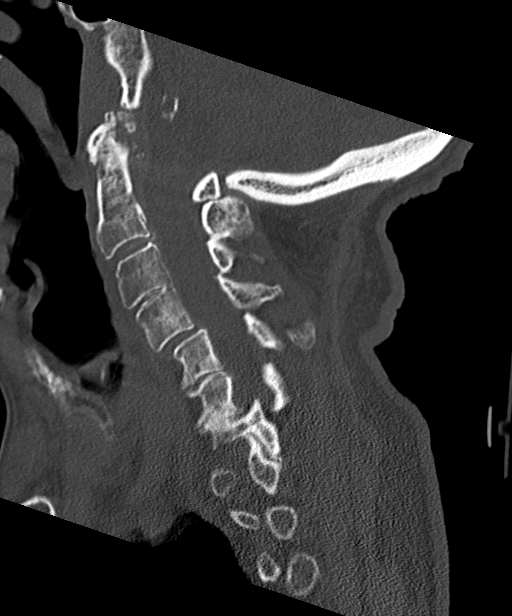
[im 36/61  bone]
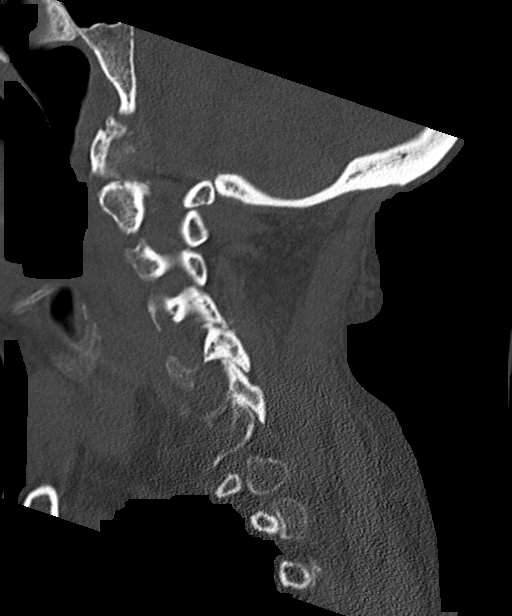
[im 41/61  bone]
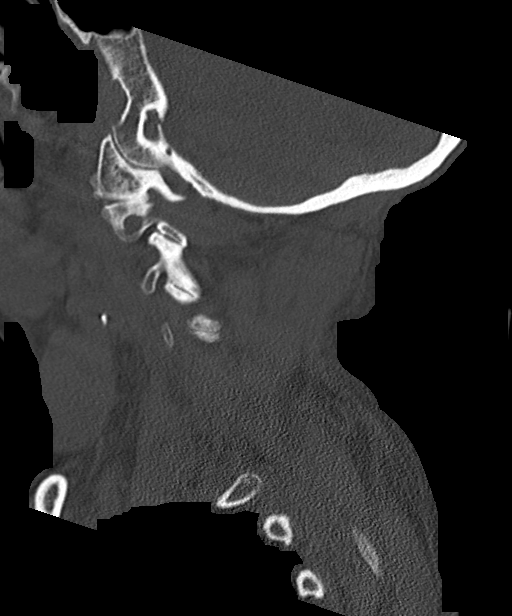

[Series 7: coronal bone · coronal · 0.23mm/px · 3 of 62 slices shown]
[im 13/62  bone]
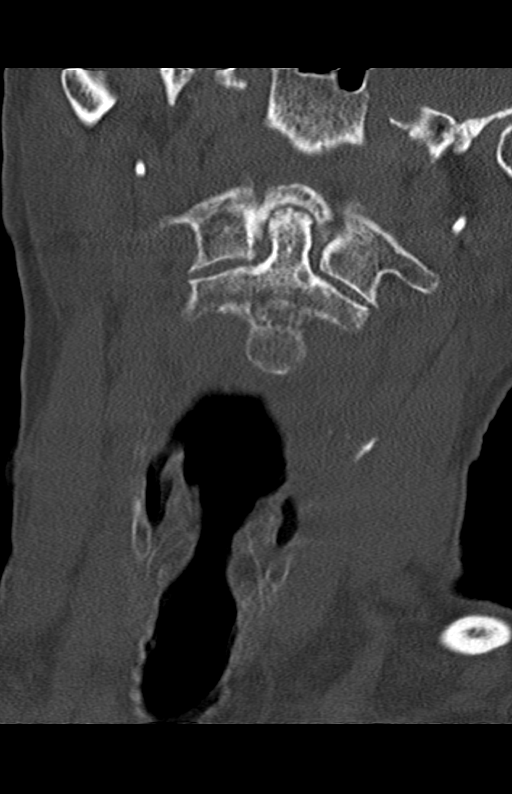
[im 25/62  bone]
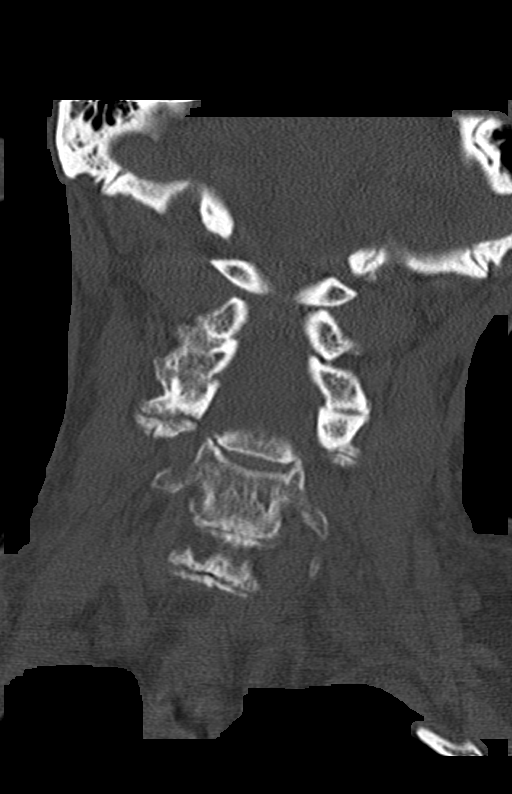
[im 37/62  bone]
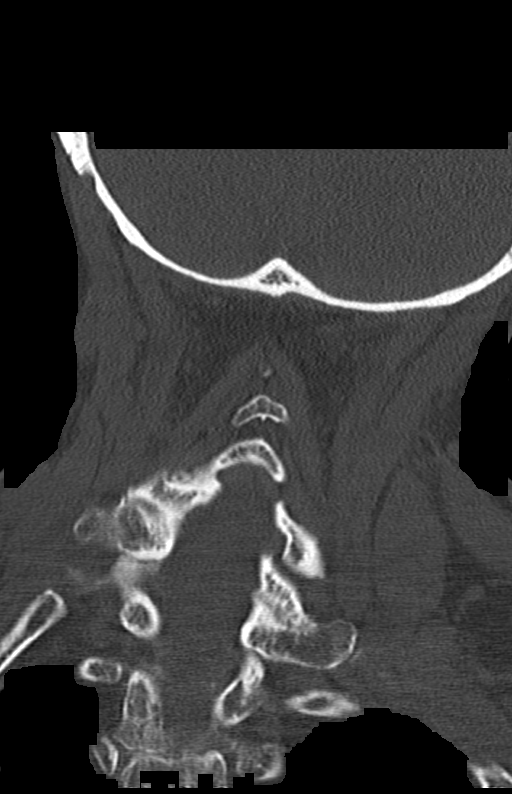

[Series 8: orthogonal bone · axial · 0.23mm/px · z∈[-256,-156]mm · 4 of 79 slices shown, 5 images]
[im 14/79  soft-tissue]
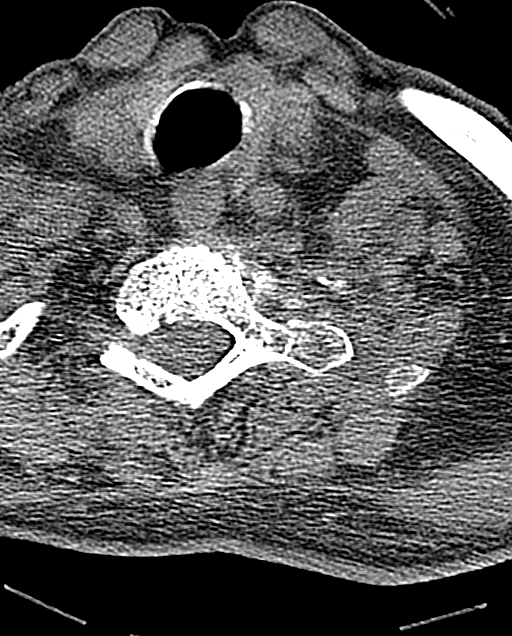
[im 14/79  bone]
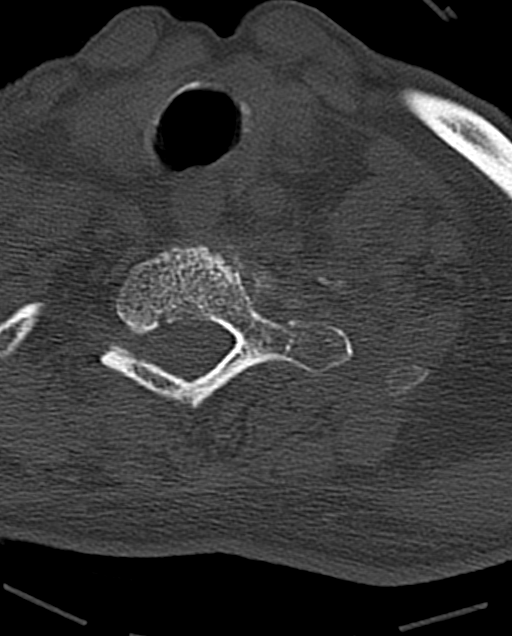
[im 27/79  bone]
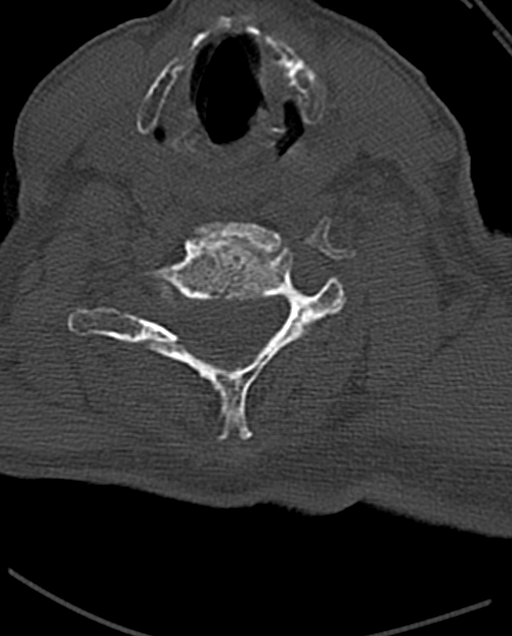
[im 53/79  bone]
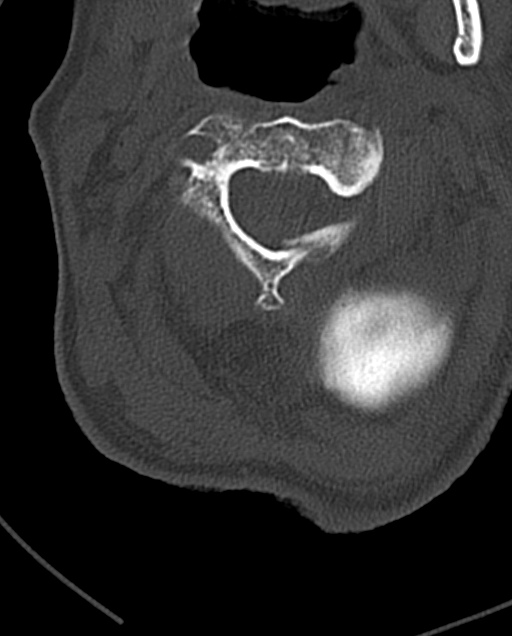
[im 66/79  bone]
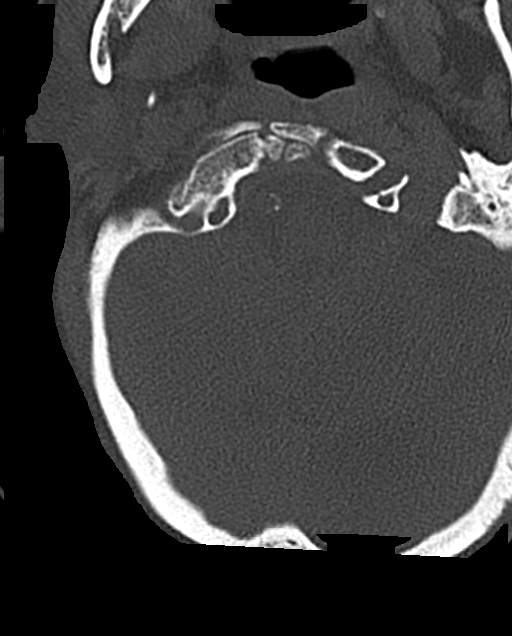

[12 of 33 positions shown; findings below may reference images not displayed]

FINDINGS: Alignment: Patient is tilted in the scanner. Grade 1 anterolisthesis
of C4 on C5 is unchanged from prior exam. No acute or traumatic
subluxation. Mild scoliotic curvature of the cervicothoracic
junction.

Skull base and vertebrae: No acute fracture. Vertebral body heights
are maintained. The dens and skull base are intact. Chronic
degenerative fragmentation at C1-C2 articulation.

Soft tissues and spinal canal: No prevertebral fluid or swelling. No
visible canal hematoma.

Disc levels: Diffuse degenerative disc disease with disc space
narrowing and endplate spurring. Multilevel facet hypertrophy.
Degenerative changes are similar to prior exam.

Upper chest: No acute findings.

Other: Mild carotid calcifications.
IMPRESSION: 1. No acute fracture or subluxation of the cervical spine.
2. Stable multilevel degenerative disc disease and facet
hypertrophy.

## 2021-09-28 IMAGING — CT CT HEAD W/O CM
3 of 5 series · 15 of 47 positions shown, 18 images · non-contrast
Comparison: Head and cervical spine CT 10/25/2017

CLINICAL DATA: [HOSPITAL] patient post unwitnessed fall out of
bed. Neck pain.

EXAM:
CT HEAD WITHOUT CONTRAST
TECHNIQUE: Contiguous axial images were obtained from the base of the skull
through the vertex without intravenous contrast.

[Series 2: head wo · axial · 0.47mm/px · z∈[-134,-9]mm · 9 of 31 slices shown, 12 images]
[im 3/31  brain]
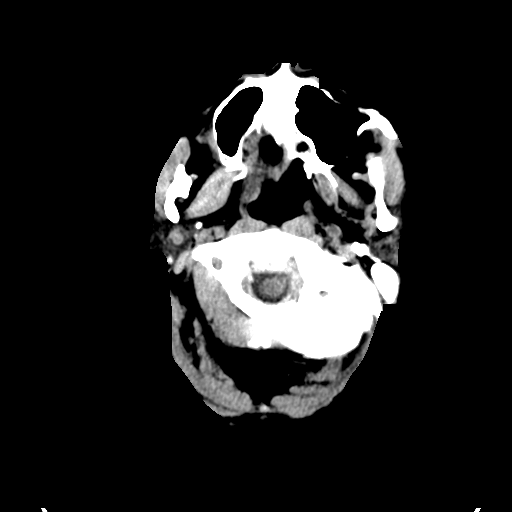
[im 3/31  bone]
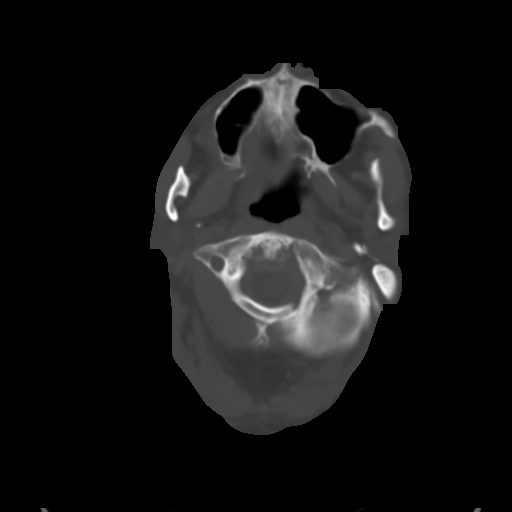
[im 6/31  brain]
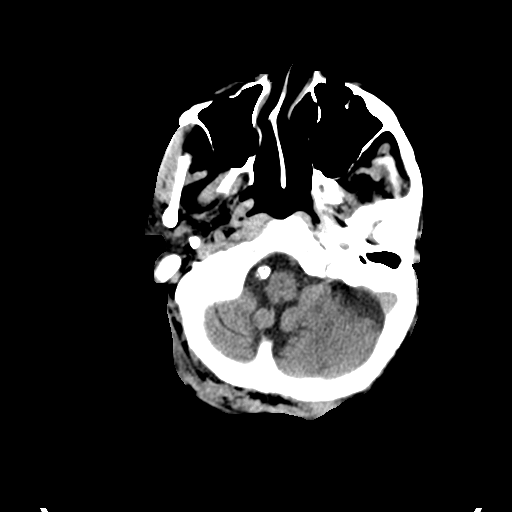
[im 9/31  brain]
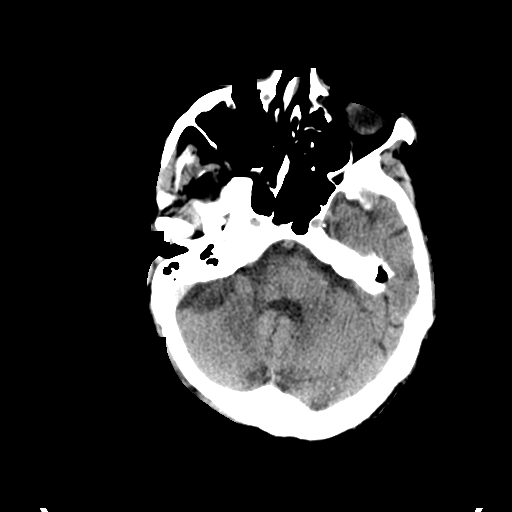
[im 12/31  brain]
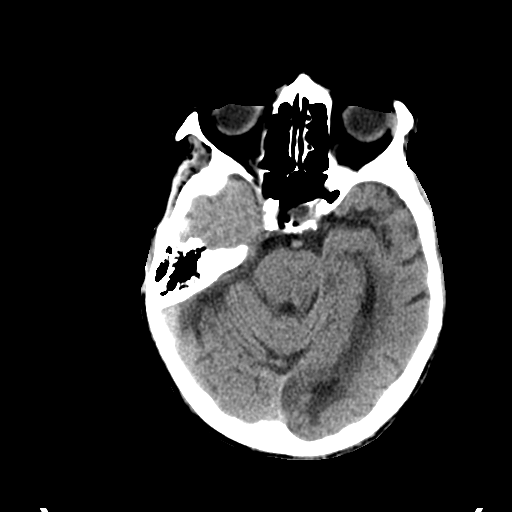
[im 16/31  brain]
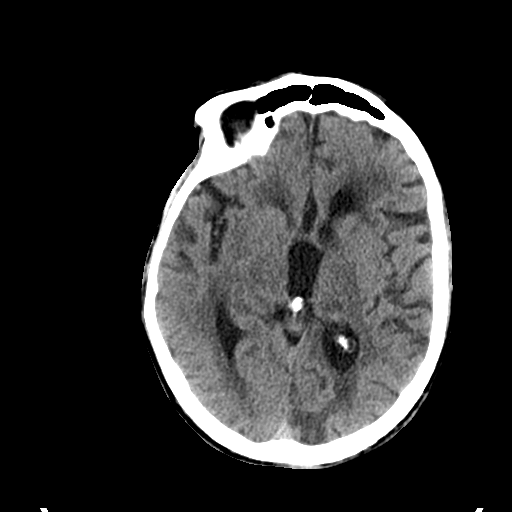
[im 16/31  bone]
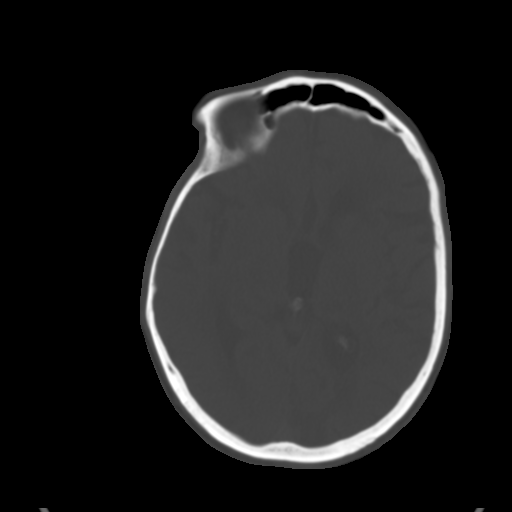
[im 19/31  brain]
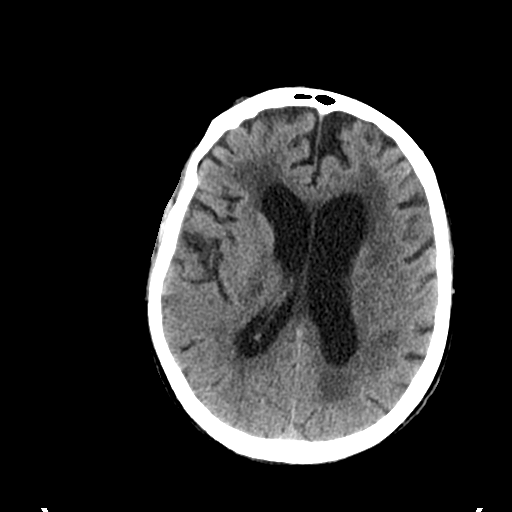
[im 22/31  brain]
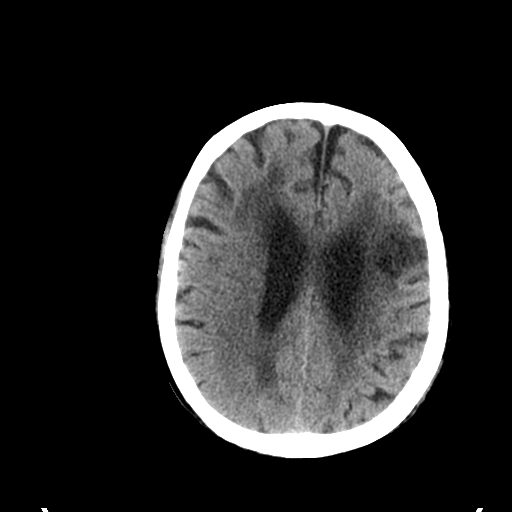
[im 25/31  brain]
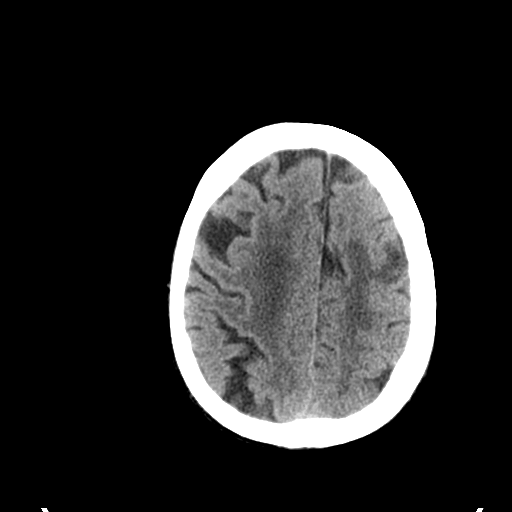
[im 28/31  brain]
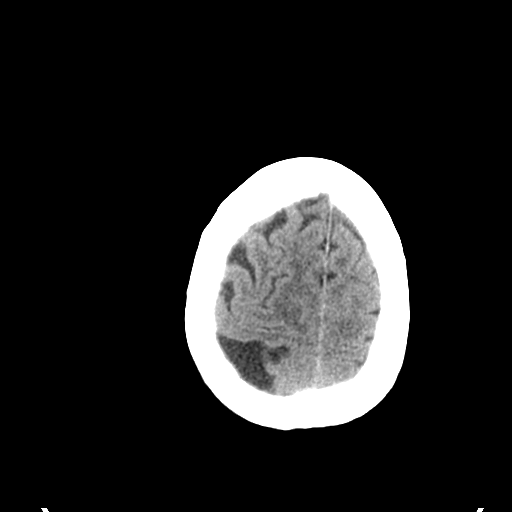
[im 28/31  bone]
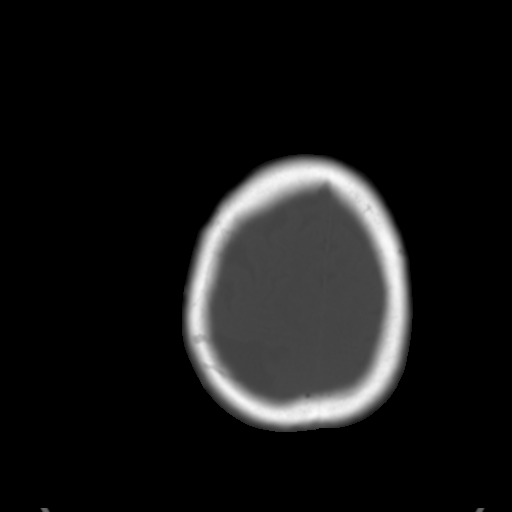

[Series 4: coronal soft tissue · coronal · 0.32mm/px · 3 of 84 slices shown]
[im 28/84  brain]
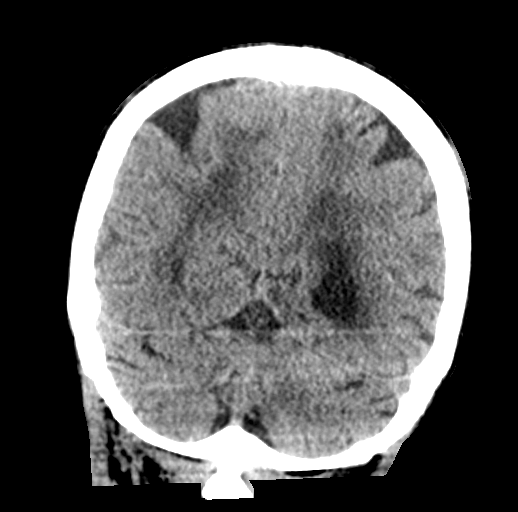
[im 37/84  brain]
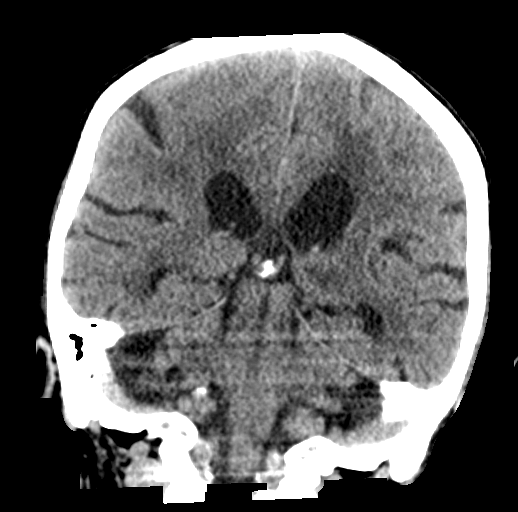
[im 47/84  brain]
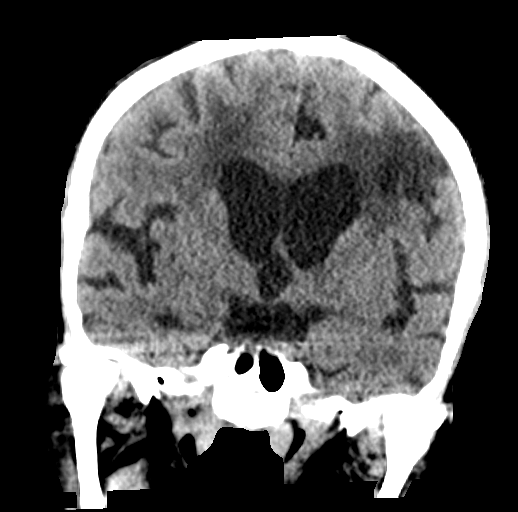

[Series 5: sagittal soft tissue · sagittal · 0.31mm/px · 3 of 60 slices shown]
[im 20/60  brain]
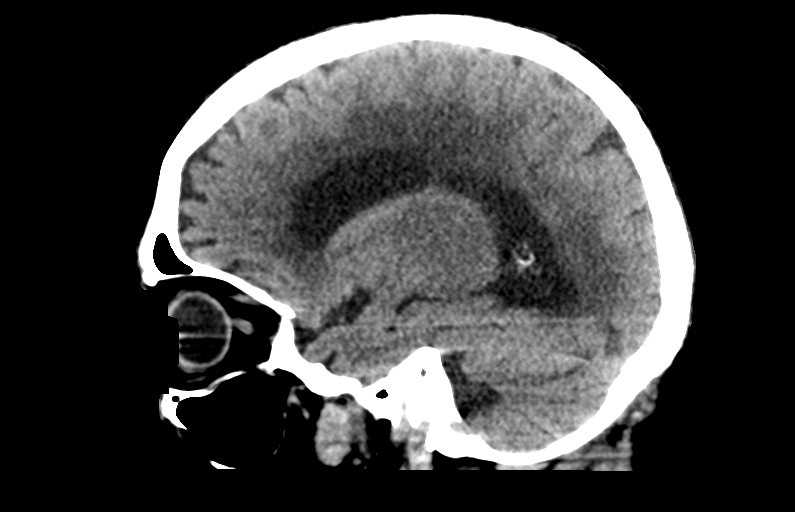
[im 30/60  brain]
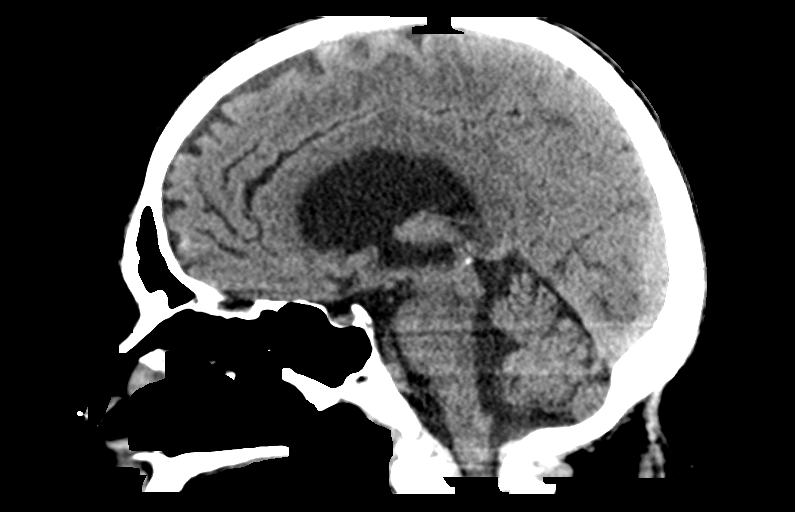
[im 40/60  brain]
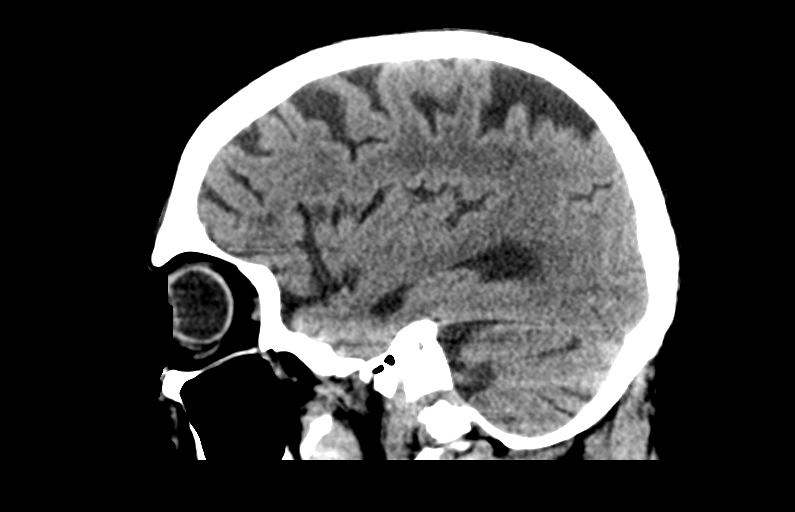

[15 of 47 positions shown; findings below may reference images not displayed]

FINDINGS: Brain: Left occipital encephalomalacia and hypodensities consistent
with remote infarct, however new from 5474. No encephalomalacia in
the left frontal lobe. Remote lacunar infarct in the right basal
ganglia, also new. No acute hemorrhage, mass effect, or midline
shift. Generalized atrophy and chronic small vessel ischemia. No
subdural or extra-axial collection.

Vascular: Atherosclerosis of skullbase vasculature without
hyperdense vessel or abnormal calcification.

Skull: No fracture or focal lesion.

Sinuses/Orbits: No acute findings. Paranasal sinuses are clear.
Bilateral cataract resection. Mastoid air cells are hypo
pneumatized.

Other: None.
IMPRESSION: 1. No acute intracranial abnormality. No skull fracture.
2. Encephalomalacia in the left occipital lobe and left frontal
lobe. Findings consistent with remote infarcts, however new from
5474 exam. Background atrophy and chronic small vessel ischemia.
